# Patient Record
Sex: Female | Born: 1970 | Race: White | Hispanic: No | Marital: Married | Smoking: Former smoker
Health system: Southern US, Community
[De-identification: ages and names within clinical notes are randomized; demographics above are authoritative.]

## PROBLEM LIST (undated history)

## (undated) DIAGNOSIS — M549 Dorsalgia, unspecified: Secondary | ICD-10-CM

## (undated) DIAGNOSIS — R87629 Unspecified abnormal cytological findings in specimens from vagina: Secondary | ICD-10-CM

## (undated) DIAGNOSIS — K759 Inflammatory liver disease, unspecified: Secondary | ICD-10-CM

## (undated) DIAGNOSIS — E039 Hypothyroidism, unspecified: Secondary | ICD-10-CM

## (undated) DIAGNOSIS — N920 Excessive and frequent menstruation with regular cycle: Secondary | ICD-10-CM

## (undated) DIAGNOSIS — K635 Polyp of colon: Secondary | ICD-10-CM

## (undated) DIAGNOSIS — F4310 Post-traumatic stress disorder, unspecified: Secondary | ICD-10-CM

## (undated) DIAGNOSIS — E079 Disorder of thyroid, unspecified: Secondary | ICD-10-CM

## (undated) DIAGNOSIS — F32A Depression, unspecified: Secondary | ICD-10-CM

## (undated) DIAGNOSIS — R102 Pelvic and perineal pain: Secondary | ICD-10-CM

## (undated) DIAGNOSIS — F329 Major depressive disorder, single episode, unspecified: Secondary | ICD-10-CM

## (undated) DIAGNOSIS — N926 Irregular menstruation, unspecified: Secondary | ICD-10-CM

## (undated) DIAGNOSIS — F419 Anxiety disorder, unspecified: Secondary | ICD-10-CM

## (undated) DIAGNOSIS — Z8719 Personal history of other diseases of the digestive system: Secondary | ICD-10-CM

## (undated) DIAGNOSIS — K219 Gastro-esophageal reflux disease without esophagitis: Secondary | ICD-10-CM

## (undated) DIAGNOSIS — D510 Vitamin B12 deficiency anemia due to intrinsic factor deficiency: Secondary | ICD-10-CM

## (undated) DIAGNOSIS — E785 Hyperlipidemia, unspecified: Secondary | ICD-10-CM

## (undated) HISTORY — DX: Depression, unspecified: F32.A

## (undated) HISTORY — DX: Polyp of colon: K63.5

## (undated) HISTORY — PX: CHOLECYSTECTOMY: SHX55

## (undated) HISTORY — DX: Excessive and frequent menstruation with regular cycle: N92.0

## (undated) HISTORY — DX: Vitamin B12 deficiency anemia due to intrinsic factor deficiency: D51.0

## (undated) HISTORY — PX: ABDOMINAL HYSTERECTOMY: SHX81

## (undated) HISTORY — PX: COLPOSCOPY: SHX161

## (undated) HISTORY — DX: Pelvic and perineal pain: R10.2

## (undated) HISTORY — DX: Irregular menstruation, unspecified: N92.6

## (undated) HISTORY — DX: Unspecified abnormal cytological findings in specimens from vagina: R87.629

## (undated) HISTORY — DX: Hyperlipidemia, unspecified: E78.5

## (undated) HISTORY — DX: Dorsalgia, unspecified: M54.9

---
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
## 2008-10-21 HISTORY — PX: GALLBLADDER SURGERY: SHX652

## 2014-12-23 LAB — HM PAP SMEAR: HM Pap smear: NEGATIVE

## 2015-01-20 ENCOUNTER — Ambulatory Visit
Admit: 2015-01-20 | Disposition: A | Discharge status: Home / Self Care | Payer: Self-pay | Attending: Obstetrics and Gynecology | Admitting: Obstetrics and Gynecology

## 2015-04-28 ENCOUNTER — Other Ambulatory Visit: Payer: Self-pay

## 2015-04-28 MED ORDER — CHOLESTYRAMINE 4 GM/DOSE PO POWD: 4.0000 g | Freq: Two times a day (BID) | ORAL | Status: DC

## 2015-04-28 NOTE — Telephone Encounter (Signed)
Called patient to let her know that refill has been sent to the pharmacy at this time.  No answer. Left VM with this information and to call back with any questions.

## 2015-05-30 ENCOUNTER — Other Ambulatory Visit: Payer: Self-pay

## 2015-05-30 NOTE — Telephone Encounter (Signed)
Received refill request for Cholestyramine at this time for this patient.  Pt last seen by Vickey Huger, NP on 12/28/14 and was to have Colonoscopy but cancelled this procedure.  Refill request was denied until patient has follow-up recommended. Pharmacy notified at this time.

## 2015-12-25 ENCOUNTER — Encounter: Payer: Self-pay | Admitting: *Deleted

## 2015-12-27 ENCOUNTER — Encounter: Payer: Self-pay | Admitting: Obstetrics and Gynecology

## 2016-02-15 ENCOUNTER — Other Ambulatory Visit: Payer: Self-pay | Admitting: Obstetrics and Gynecology

## 2016-02-15 ENCOUNTER — Encounter: Payer: Self-pay | Admitting: Obstetrics and Gynecology

## 2016-02-15 ENCOUNTER — Ambulatory Visit (INDEPENDENT_AMBULATORY_CARE_PROVIDER_SITE_OTHER): Payer: BLUE CROSS/BLUE SHIELD | Admitting: Obstetrics and Gynecology

## 2016-02-15 VITALS — BP 130/75 | HR 70 | Ht 62.0 in | Wt 151.9 lb

## 2016-02-15 DIAGNOSIS — E663 Overweight: Secondary | ICD-10-CM

## 2016-02-15 DIAGNOSIS — E559 Vitamin D deficiency, unspecified: Secondary | ICD-10-CM | POA: Diagnosis not present

## 2016-02-15 DIAGNOSIS — Z01419 Encounter for gynecological examination (general) (routine) without abnormal findings: Secondary | ICD-10-CM

## 2016-02-15 NOTE — Patient Instructions (Signed)
  Place annual gynecologic exam patient instructions here.  Thank you for enrolling in [facility]. Please follow the instructions below to securely access your online medical record. MyChart allows you to send messages to your doctor, view your test results, manage appointments, and more.   How Do I Sign Up? 1. In your Internet browser, go to [employer] and enter [email or web address]. 2. Click on the Sign Up Now link in the Sign In box. You will see the New Member Sign Up page. 3. Enter your MyChart Access Code exactly as it appears below. You will not need to use this code after you've completed the sign-up process. If you do not sign up before the expiration date, you must request a new code.  MyChart Access Code: [email or web address] Expires: 02/24/2016 12:40 PM  4. Enter your Social Security Number ([id number]) and Date of Birth (mm/dd/yyyy) as indicated and click Submit. You will be taken to the next sign-up page. 5. Create a MyChart ID. This will be your MyChart login ID and cannot be changed, so think of one that is secure and easy to remember. 6. Create a MyChart password. You can change your password at any time. 7. Enter your Password Reset Question and Answer. This can be used at a later time if you forget your password.  8. Enter your e-mail address. You will receive e-mail notification when new information is available in [facility]. 9. Click Sign Up. You can now view your medical record.   Additional Information Remember, MyChart is NOT to be used for urgent needs. For medical emergencies, dial 911.

## 2016-02-15 NOTE — Progress Notes (Signed)
Subjective:   Evelyn Holloway is a 45 y.o. G31P0011 Caucasian female here for a routine well-woman exam.  Patient's last menstrual period was 01/29/2016.    Current complaints: weight gain PCP: none       does desire labs  Social History: Sexual: heterosexual Marital Status: divorced Living situation: with family Occupation: PT Tobacco/alcohol: no alcohol use Illicit drugs: no history of illicit drug use  The following portions of the patient's history were reviewed and updated as appropriate: allergies, current medications, past family history, past medical history, past social history, past surgical history and problem list.  Past Medical History Past Medical History  Diagnosis Date  . Vaginal Pap smear, abnormal   . Vitamin B12 deficiency anemia due to intrinsic factor deficiency   . Irregular menses   . Pelvic pain in female   . Heavy periods     Past Surgical History Past Surgical History  Procedure Laterality Date  . Colposcopy    . Gallbladder surgery  2010    Gynecologic History G2P0011  Patient's last menstrual period was 01/29/2016. Contraception: condoms Last Pap: 2016. Results were: normal, H/O LEEP in past Last mammogram: 2016. Results were: normal   Obstetric History OB History  Gravida Para Term Preterm AB SAB TAB Ectopic Multiple Living  2    1  1   1     # Outcome Date GA Lbr Len/2nd Weight Sex Delivery Anes PTL Lv  2 Gravida 1995    F Vag-Spont   Y  1 TAB               Current Medications Current Outpatient Prescriptions on File Prior to Visit  Medication Sig Dispense Refill  . cholestyramine (QUESTRAN) 4 GM/DOSE powder Take 1 packet (4 g total) by mouth 2 (two) times daily with a meal. (Patient not taking: Reported on 02/15/2016) 120 g 9   No current facility-administered medications on file prior to visit.    Review of Systems Patient denies any headaches, blurred vision, shortness of breath, chest pain, abdominal pain, problems with  bowel movements, urination, or intercourse.  Objective:  BP 130/75 mmHg  Pulse 70  Ht 5\' 2"  (1.575 m)  Wt 151 lb 14.4 oz (68.901 kg)  BMI 27.78 kg/m2  LMP 01/29/2016 Physical Exam  General:  Well developed, well nourished, no acute distress. She is alert and oriented x3. Skin:  Warm and dry Neck:  Midline trachea, no thyromegaly or nodules Cardiovascular: Regular rate and rhythm, no murmur heard Lungs:  Effort normal, all lung fields clear to auscultation bilaterally Breasts:  No dominant palpable mass, retraction, or nipple discharge Abdomen:  Soft, non tender, no hepatosplenomegaly or masses Pelvic:  External genitalia is normal in appearance.  The vagina is normal in appearance. The cervix is bulbous, no CMT.  Thin prep pap is done with HR HPV cotesting. Uterus is felt to be normal size, shape, and contour.  No adnexal masses or tenderness noted. Extremities:  No swelling or varicosities noted Psych:  She has a normal mood and affect  Assessment:   Healthy well-woman exam Vit D Deficiency Overweight   Plan:  Pap and labs obtained F/U 1 year for AE, or sooner if needed Mammogram scheduled  Melody Rockney Ghee, CNM

## 2016-02-16 LAB — HEMOGLOBIN A1C
Est. average glucose Bld gHb Est-mCnc: 111 mg/dL
Hgb A1c MFr Bld: 5.5 % (ref 4.8–5.6)

## 2016-02-16 LAB — COMPREHENSIVE METABOLIC PANEL
ALBUMIN: 4.7 g/dL (ref 3.5–5.5)
ALK PHOS: 80 IU/L (ref 39–117)
ALT: 20 IU/L (ref 0–32)
AST: 17 IU/L (ref 0–40)
Albumin/Globulin Ratio: 2.1 (ref 1.2–2.2)
BUN / CREAT RATIO: 16 (ref 9–23)
BUN: 9 mg/dL (ref 6–24)
Bilirubin Total: 0.3 mg/dL (ref 0.0–1.2)
CHLORIDE: 100 mmol/L (ref 96–106)
CO2: 18 mmol/L (ref 18–29)
Calcium: 9.1 mg/dL (ref 8.7–10.2)
Creatinine, Ser: 0.58 mg/dL (ref 0.57–1.00)
GFR calc Af Amer: 131 mL/min/{1.73_m2} (ref >59)
GFR calc non Af Amer: 113 mL/min/{1.73_m2} (ref >59)
GLOBULIN, TOTAL: 2.2 g/dL (ref 1.5–4.5)
GLUCOSE: 95 mg/dL (ref 65–99)
Potassium: 4.6 mmol/L (ref 3.5–5.2)
SODIUM: 139 mmol/L (ref 134–144)
Total Protein: 6.9 g/dL (ref 6.0–8.5)

## 2016-02-16 LAB — THYROID PANEL WITH TSH
Free Thyroxine Index: 1.8 (ref 1.2–4.9)
T3 Uptake Ratio: 26 % (ref 24–39)
T4 TOTAL: 7 ug/dL (ref 4.5–12.0)
TSH: 5 u[IU]/mL — AB (ref 0.450–4.500)

## 2016-02-16 LAB — VITAMIN D 25 HYDROXY (VIT D DEFICIENCY, FRACTURES): Vit D, 25-Hydroxy: 47.7 ng/mL (ref 30.0–100.0)

## 2016-02-19 LAB — CYTOLOGY - PAP

## 2016-02-20 ENCOUNTER — Other Ambulatory Visit: Payer: Self-pay | Admitting: Obstetrics and Gynecology

## 2016-02-20 DIAGNOSIS — R7989 Other specified abnormal findings of blood chemistry: Secondary | ICD-10-CM

## 2016-02-26 ENCOUNTER — Telehealth: Payer: Self-pay | Admitting: *Deleted

## 2016-02-26 ENCOUNTER — Other Ambulatory Visit: Payer: Self-pay | Admitting: *Deleted

## 2016-02-26 NOTE — Telephone Encounter (Signed)
Notified pt of lab results, she will come in 2 weeks for repeat labs

## 2016-03-11 ENCOUNTER — Other Ambulatory Visit: Payer: BLUE CROSS/BLUE SHIELD

## 2016-03-11 ENCOUNTER — Other Ambulatory Visit: Payer: Self-pay | Admitting: *Deleted

## 2016-03-11 ENCOUNTER — Other Ambulatory Visit: Payer: Self-pay

## 2016-03-11 ENCOUNTER — Other Ambulatory Visit: Payer: Self-pay | Admitting: Obstetrics and Gynecology

## 2016-03-11 DIAGNOSIS — Z01419 Encounter for gynecological examination (general) (routine) without abnormal findings: Secondary | ICD-10-CM

## 2016-03-11 DIAGNOSIS — E785 Hyperlipidemia, unspecified: Secondary | ICD-10-CM

## 2016-03-11 DIAGNOSIS — R7989 Other specified abnormal findings of blood chemistry: Secondary | ICD-10-CM

## 2016-03-11 MED ORDER — VITAMIN D (ERGOCALCIFEROL) 1.25 MG (50000 UNIT) PO CAPS: 50000.0000 [IU] | ORAL_CAPSULE | ORAL | Status: DC

## 2016-03-12 LAB — THYROID PANEL WITH TSH
FREE THYROXINE INDEX: 1.9 (ref 1.2–4.9)
T3 Uptake Ratio: 28 % (ref 24–39)
T4, Total: 6.8 ug/dL (ref 4.5–12.0)
TSH: 6.24 u[IU]/mL — AB (ref 0.450–4.500)

## 2016-03-12 LAB — THYROGLOBULIN ANTIBODY: Thyroglobulin Antibody: 8.5 IU/mL — ABNORMAL HIGH (ref 0.0–0.9)

## 2016-03-13 LAB — NMR, LIPOPROFILE
Cholesterol: 233 mg/dL — ABNORMAL HIGH (ref 100–199)
HDL CHOLESTEROL BY NMR: 87 mg/dL (ref >39)
HDL PARTICLE NUMBER: 45.3 umol/L (ref >=30.5)
LDL Particle Number: 1218 nmol/L — ABNORMAL HIGH (ref <1000)
LDL SIZE: 22 nm (ref >20.5)
LDL-C: 121 mg/dL — ABNORMAL HIGH (ref 0–99)
SMALL LDL PARTICLE NUMBER: 134 nmol/L (ref <=527)
Triglycerides by NMR: 123 mg/dL (ref 0–149)

## 2016-03-21 ENCOUNTER — Other Ambulatory Visit: Payer: Self-pay | Admitting: Obstetrics and Gynecology

## 2016-03-21 DIAGNOSIS — E785 Hyperlipidemia, unspecified: Secondary | ICD-10-CM

## 2016-03-21 DIAGNOSIS — R7989 Other specified abnormal findings of blood chemistry: Secondary | ICD-10-CM | POA: Insufficient documentation

## 2016-03-26 ENCOUNTER — Other Ambulatory Visit: Payer: Self-pay | Admitting: Obstetrics and Gynecology

## 2016-03-26 ENCOUNTER — Telehealth: Payer: Self-pay | Admitting: Obstetrics and Gynecology

## 2016-03-26 NOTE — Telephone Encounter (Signed)
Pt notified of referral. She would like to know about her cholesterol medication, take or not. I gave her results.

## 2016-03-26 NOTE — Telephone Encounter (Signed)
SHE CALLED FOR RESULTS THAT SHE HAD A FEW KS AGO. SHE SAID SHE SAW THEM BUT WANTS TO KNOW WHAT CAN BE DONE NEXT... DOES SHE NEED TO GO TO SPECIALIST OR WHAT?

## 2016-03-26 NOTE — Telephone Encounter (Signed)
Yes please let her know I placed referral order to [facility]- and hopefully she should get a call from them to set up an appointment within the week. It looks like her tyroid is not functioning fully, and may be an autoimmune cause. But will know more when they see her as that is what they specialize in.

## 2016-04-01 ENCOUNTER — Encounter: Payer: Self-pay | Admitting: Obstetrics and Gynecology

## 2016-05-06 ENCOUNTER — Ambulatory Visit (INDEPENDENT_AMBULATORY_CARE_PROVIDER_SITE_OTHER): Payer: BLUE CROSS/BLUE SHIELD | Admitting: Endocrinology

## 2016-05-06 ENCOUNTER — Encounter: Payer: Self-pay | Admitting: Endocrinology

## 2016-05-06 VITALS — BP 126/76 | HR 80 | Ht 62.0 in | Wt 150.0 lb

## 2016-05-06 DIAGNOSIS — E038 Other specified hypothyroidism: Secondary | ICD-10-CM

## 2016-05-06 DIAGNOSIS — E063 Autoimmune thyroiditis: Secondary | ICD-10-CM

## 2016-05-06 MED ORDER — LEVOTHYROXINE SODIUM 25 MCG PO TABS: 25.0000 ug | ORAL_TABLET | Freq: Every day | ORAL | Status: DC

## 2016-05-06 NOTE — Progress Notes (Signed)
Patient ID: Evelyn Holloway, female   DOB: 10-02-1971, 45 y.o.   MRN: [id number]           Chief complaint: Weight gain  Referring provider: Melody Shambley  History of Present Illness:  Patient thinks that she had no difficulty maintaining her weight in the 130+ range for the last few years After she went on a cruise in February her weight went up by about 18-20 pounds over 2 months  When she went to her gynecologist she was checked or hypothyroidism and was found to have a high TSH Currently she does not complain of any unusual fatigue.  She thinks she does get tired because of working long hours but does not think this is any worse than usual. She does not have any new symptoms of hair loss, constipation, hoarseness or swelling in her hands or feet. She does have some mild chronic hair loss No true cold intolerance.  She sometimes feels cold at night  She is concerned that she is trying to exercise 1-2 hours aerobically up to 5 days a week and not losing weight She thinks she is also watching her diet with usually not eating fried foods or any junk food but mostly chicken, fish and steak Her weight appears to have stabilized but she is concerned that she is not losing any  Wt Readings from Last 3 Encounters:  05/06/16 150 lb (68.04 kg)  02/15/16 151 lb 14.4 oz (68.901 kg)    Lab Results  Component Value Date   TSH 6.240* 03/11/2016   TSH 5.000* 02/15/2016      Past Medical History  Diagnosis Date  . Vaginal Pap smear, abnormal   . Vitamin B12 deficiency anemia due to intrinsic factor deficiency   . Irregular menses   . Pelvic pain in female   . Heavy periods   . Hyperlipidemia     Past Surgical History  Procedure Laterality Date  . Colposcopy    . Gallbladder surgery  2010    Family History  Problem Relation Age of Onset  . Cancer Maternal Grandfather     colon  . Hypertension Mother   . Thyroid disease Maternal Grandmother   . Diabetes Neg Hx   . Heart  disease Neg Hx     Social History:  reports that she has been smoking.  She has never used smokeless tobacco. She reports that she drinks alcohol. She reports that she does not use illicit drugs.  Allergies: No Known Allergies    Medication List       This list is accurate as of: 05/06/16  8:56 PM.  Always use your most recent med list.               levothyroxine 25 MCG tablet  Commonly known as:  SYNTHROID  Take 1 tablet (25 mcg total) by mouth daily before breakfast.     Vitamin D (Ergocalciferol) 50000 units Caps capsule  Commonly known as:  DRISDOL  Take 1 capsule (50,000 Units total) by mouth once a week.        LABS:  No visits with results within 1 Week(s) from this visit. Latest known visit with results is:  Orders Only on 03/11/2016  Component Date Value Ref Range Status  . TSH 03/11/2016 6.240* 0.450 - 4.500 uIU/mL Final  . T4, Total 03/11/2016 6.8  4.5 - 12.0 ug/dL Final  . T3 Uptake Ratio 03/11/2016 28  24 - 39 % Final  . Free Thyroxine Index  03/11/2016 1.9  1.2 - 4.9 Final  . Thyroglobulin Antibody 03/11/2016 8.5* 0.0 - 0.9 IU/mL Final   Thyroglobulin Antibody measured by Beckman Coulter Methodology     REVIEW OF SYSTEMS:        Review of Systems  Constitutional: Positive for weight gain.  HENT: Negative for hoarseness.   Eyes:       Occasional transient blurring of vision, see neurology section  Respiratory: Negative for shortness of breath.   Cardiovascular: Negative for leg swelling.  Gastrointestinal: Negative for constipation.       Occasional mild nausea especially in the morning  Endocrine: Negative for menstrual changes.       Sometimes will get cold at night.  Otherwise tends to get some hot flashes both day and night  Genitourinary: Negative for nocturia.  Musculoskeletal: Positive for muscle cramps. Negative for joint pain.       Has occasional muscle cramps in the calf muscles  Skin:       No dryness of skin or other changes    Neurological: Positive for numbness.       She may sometimes get numbness in the first toes of either side Occasionally while at work during the day she will feel a little fuzzy in her eyes and head but no lightheadedness or faintness.  Cannot describe well.  She just has to to slow down for a couple of minutes and then feels better     PHYSICAL EXAM:  BP 126/76 mmHg  Pulse 80  Ht 5\' 2"  (1.575 m)  Wt 150 lb (68.04 kg)  BMI 27.43 kg/m2  SpO2 96%  Standing blood pressure 122/72  GENERAL:  Averagely built and nourished  No pallor, clubbing, lymphadenopathy or edema.   Skin:  no rash or pigmentation.  EYES:  Externally normal.  Fundii:  normal discs and vessels.  ENT: Oral mucosa and tongue normal.  No oral pigmentation  THYROID:  Not palpable.  HEART:  Normal  S1 and S2; no murmur or click.  CHEST:  Normal shape.  Lungs: Vescicular breath sounds heard equally.  No crepitations/ wheeze.  ABDOMEN:  No distention.  Liver and spleen not palpable.  No other mass or tenderness.  NEUROLOGICAL: .Reflexes are normal bilaterally at biceps and ankles.  JOINTS:  Normal.   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  She does not appear to be significantly symptomatic and her only symptom is weight gain which is relatively rapid over a couple of months.  She does not have any menstrual disorder, impaired fasting glucose or signs of hyperandrogenism to suggest PCOS  Weight gain: This appears to have stabilized  She has hyperlipidemia with mild increase in LDL but relatively higher LDL particle number.  However has no other risk factors  Vague episodes of visual difficulty but no lightheadedness or faintness, transient and does not have any orthostatic hypotension or typical symptoms of adrenal insufficiency   PLAN:    Trial of levothyroxine 25 g daily to see if she will subjectively do any better.  Not clear this will  facilitate any weight loss.  Discussed in detail the causation of hypothyroidism by an autoimmune reaction.  Also discussed needing to supplement with levothyroxine which is identical to natural thyroid hormone product and adjust the dose based on follow-up TSH levels  No need for statin drugs or other lipid-lowering medications at this time with her not having any risk factors.  Her LDL particle number  should improve with weight loss  Follow-up in 6 weeks with thyroid levels  Jahmier Willadsen 05/06/2016, 8:56 PM   Notes sent back to consulting provider

## 2016-06-12 ENCOUNTER — Other Ambulatory Visit (INDEPENDENT_AMBULATORY_CARE_PROVIDER_SITE_OTHER): Payer: BLUE CROSS/BLUE SHIELD

## 2016-06-12 DIAGNOSIS — E063 Autoimmune thyroiditis: Secondary | ICD-10-CM

## 2016-06-12 DIAGNOSIS — E038 Other specified hypothyroidism: Secondary | ICD-10-CM

## 2016-06-12 LAB — T4, FREE: FREE T4: 0.89 ng/dL (ref 0.60–1.60)

## 2016-06-12 LAB — TSH: TSH: 1.79 u[IU]/mL (ref 0.35–4.50)

## 2016-06-13 ENCOUNTER — Other Ambulatory Visit: Payer: BLUE CROSS/BLUE SHIELD

## 2016-06-17 ENCOUNTER — Encounter: Payer: Self-pay | Admitting: Endocrinology

## 2016-06-17 ENCOUNTER — Ambulatory Visit (INDEPENDENT_AMBULATORY_CARE_PROVIDER_SITE_OTHER): Payer: Managed Care, Other (non HMO) | Admitting: Endocrinology

## 2016-06-17 VITALS — BP 105/77 | HR 67 | Ht 62.0 in | Wt 150.0 lb

## 2016-06-17 DIAGNOSIS — E063 Autoimmune thyroiditis: Secondary | ICD-10-CM

## 2016-06-17 DIAGNOSIS — E7800 Pure hypercholesterolemia, unspecified: Secondary | ICD-10-CM

## 2016-06-17 DIAGNOSIS — E038 Other specified hypothyroidism: Secondary | ICD-10-CM

## 2016-06-17 NOTE — Progress Notes (Signed)
Patient ID: Evelyn Holloway, female   DOB: [birth year], 45 y.o.   MRN: [id number]           Chief complaint: Follow-up of thyroid  Referring provider: Melody Shambley  History of Present Illness:  Patient thinks that she had no difficulty maintaining her weight in the 130+ range for the last few years but in 2017 and has been around 150.  This is despite her exercising 5 days a week and eating healthy When she went to her gynecologist she was checked or hypothyroidism and was found to have a high TSH On initial consultation she did not have any unusual fatigue.   She thinks she does get tired because of working long hours but does not think this is any worse than usual. She does not have any  symptoms of hair loss, constipation, hoarseness or swelling in her hands or feet.  Although she was asymptomatic because of her high TSH of 6.2 she was given a trial of levothyroxine 25 g daily on her initial visit in 7/17.  Do not have a goiter on baseline exam  She thinks that she has lost 2 pounds She does not feel any different with her energy level TSH is back to normal  Wt Readings from Last 3 Encounters:  06/17/16 150 lb (68 kg)  05/06/16 150 lb (68 kg)  02/15/16 151 lb 14.4 oz (68.9 kg)    Lab Results  Component Value Date   TSH 1.79 06/12/2016   TSH 6.240 (H) 03/11/2016   TSH 5.000 (H) 02/15/2016   FREET4 0.89 06/12/2016      Past Medical History:  Diagnosis Date  . Heavy periods   . Hyperlipidemia   . Irregular menses   . Pelvic pain in female   . Vaginal Pap smear, abnormal   . Vitamin B12 deficiency anemia due to intrinsic factor deficiency     Past Surgical History:  Procedure Laterality Date  . COLPOSCOPY    . GALLBLADDER SURGERY  2010    Family History  Problem Relation Age of Onset  . Cancer Maternal Grandfather     colon  . Hypertension Mother   . Thyroid disease Maternal Grandmother   . Diabetes Neg Hx   . Heart disease Neg Hx     Social History:   reports that she has been smoking.  She has never used smokeless tobacco. She reports that she drinks alcohol. She reports that she does not use drugs.  Allergies: No Known Allergies    Medication List       Accurate as of 06/17/16  4:50 PM. Always use your most recent med list.          levothyroxine 25 MCG tablet Commonly known as:  SYNTHROID Take 1 tablet (25 mcg total) by mouth daily before breakfast.   Vitamin D (Ergocalciferol) 50000 units Caps capsule Commonly known as:  DRISDOL Take 1 capsule (50,000 Units total) by mouth once a week.       LABS:  Lab on 06/12/2016  Component Date Value Ref Range Status  . TSH 06/12/2016 1.79  0.35 - 4.50 uIU/mL Final  . Free T4 06/12/2016 0.89  0.60 - 1.60 ng/dL Final        Review of Systems  She is asking about an episode of intermenstrual bleeding Also concerned about reddish areas on her neck  HYPERLIPIDEMIA: Her previous physicians had recommended a statin drug but she does not have any other risk factors. LDL particle number was  over 1200 but LDL was only 121 and LDL size was normal at 22   PHYSICAL EXAM:  BP 105/77   Pulse 67   Ht 5\' 2"  (1.575 m)   Wt 150 lb (68 kg)   BMI 27.44 kg/m     ASSESSMENT:   Mild primary hypothyroidism, likely asymptomatic, with high baseline TSH and positive thyroglobulin antibody.   Although she does not report any change in her physical symptoms she has lost a couple of pounds according to her scales at home TSH is normal now with 25 g of levothyroxine  Discussed with the patient that since she is subjectively not feeling any different with trial of levothyroxine she likely has subclinical hypothyroidism and does not really need to be on thyroid supplementation at this time Also does not have a goiter at baseline  However the patient prefers to stay on the medication  Also recommended that she does not start any treatment for mild hypercholesterolemia without any other risk  factors  PLAN:   Continue 25 g levothyroxine Follow-up in 6 months with thyroid levels and lipid levels  [name] 06/17/2016, 4:50 PM

## 2016-08-08 ENCOUNTER — Other Ambulatory Visit: Payer: Self-pay | Admitting: Obstetrics and Gynecology

## 2016-09-02 ENCOUNTER — Other Ambulatory Visit: Payer: Self-pay | Admitting: Endocrinology

## 2016-12-09 ENCOUNTER — Encounter: Payer: Self-pay | Admitting: Emergency Medicine

## 2016-12-09 ENCOUNTER — Emergency Department
Admission: EM | Admit: 2016-12-09 | Discharge: 2016-12-10 | Disposition: A | Discharge status: Home / Self Care | Payer: Worker's Compensation | Attending: Emergency Medicine | Admitting: Emergency Medicine

## 2016-12-09 DIAGNOSIS — F17200 Nicotine dependence, unspecified, uncomplicated: Secondary | ICD-10-CM | POA: Diagnosis not present

## 2016-12-09 DIAGNOSIS — M545 Low back pain: Secondary | ICD-10-CM | POA: Insufficient documentation

## 2016-12-09 DIAGNOSIS — M62838 Other muscle spasm: Secondary | ICD-10-CM | POA: Diagnosis not present

## 2016-12-09 DIAGNOSIS — R109 Unspecified abdominal pain: Secondary | ICD-10-CM

## 2016-12-09 DIAGNOSIS — Z79899 Other long term (current) drug therapy: Secondary | ICD-10-CM | POA: Insufficient documentation

## 2016-12-09 LAB — CBC WITH DIFFERENTIAL/PLATELET
Basophils Absolute: 0.1 10*3/uL (ref 0–0.1)
Basophils Relative: 1 %
EOS PCT: 5 %
Eosinophils Absolute: 0.6 10*3/uL (ref 0–0.7)
HCT: 43.4 % (ref 35.0–47.0)
Hemoglobin: 14.9 g/dL (ref 12.0–16.0)
LYMPHS ABS: 2.6 10*3/uL (ref 1.0–3.6)
Lymphocytes Relative: 23 %
MCH: 31.5 pg (ref 26.0–34.0)
MCHC: 34.3 g/dL (ref 32.0–36.0)
MCV: 91.9 fL (ref 80.0–100.0)
MONO ABS: 0.9 10*3/uL (ref 0.2–0.9)
MONOS PCT: 8 %
Neutro Abs: 7.1 10*3/uL — ABNORMAL HIGH (ref 1.4–6.5)
Neutrophils Relative %: 63 %
PLATELETS: 297 10*3/uL (ref 150–440)
RBC: 4.72 MIL/uL (ref 3.80–5.20)
RDW: 12.1 % (ref 11.5–14.5)
WBC: 11.3 10*3/uL — ABNORMAL HIGH (ref 3.6–11.0)

## 2016-12-09 MED ORDER — CARISOPRODOL 350 MG PO TABS: 350.0000 mg | ORAL_TABLET | Freq: Three times a day (TID) | ORAL | 0 refills | Status: DC | PRN

## 2016-12-09 MED ORDER — OXYCODONE-ACETAMINOPHEN 5-325 MG PO TABS
ORAL_TABLET | ORAL | Status: AC
  Filled 2016-12-09: qty 1

## 2016-12-09 MED ORDER — DICLOFENAC SODIUM 3 % TD GEL: 1.0000 "application " | Freq: Two times a day (BID) | TRANSDERMAL | 0 refills | Status: DC | PRN

## 2016-12-09 MED ORDER — OXYCODONE-ACETAMINOPHEN 5-325 MG PO TABS: 1.0000 | ORAL_TABLET | Freq: Once | ORAL | Status: DC
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
## 2017-02-18 ENCOUNTER — Encounter: Payer: BLUE CROSS/BLUE SHIELD | Admitting: Obstetrics and Gynecology

## 2017-03-19 ENCOUNTER — Encounter: Payer: BLUE CROSS/BLUE SHIELD | Admitting: Obstetrics and Gynecology

## 2017-05-30 ENCOUNTER — Encounter: Payer: BLUE CROSS/BLUE SHIELD | Admitting: Obstetrics and Gynecology

## 2017-06-10 ENCOUNTER — Other Ambulatory Visit: Payer: Self-pay | Admitting: Physical Medicine and Rehabilitation

## 2017-06-11 ENCOUNTER — Other Ambulatory Visit: Payer: Self-pay | Admitting: Physical Medicine and Rehabilitation

## 2017-06-11 DIAGNOSIS — G8911 Acute pain due to trauma: Secondary | ICD-10-CM

## 2017-06-11 DIAGNOSIS — R1010 Upper abdominal pain, unspecified: Secondary | ICD-10-CM

## 2017-06-16 ENCOUNTER — Ambulatory Visit
Admission: RE | Admit: 2017-06-16 | Discharge: 2017-06-16 | Disposition: A | Discharge status: Home / Self Care | Payer: Worker's Compensation | Source: Ambulatory Visit | Attending: Physical Medicine and Rehabilitation | Admitting: Physical Medicine and Rehabilitation

## 2017-06-16 DIAGNOSIS — R109 Unspecified abdominal pain: Secondary | ICD-10-CM | POA: Diagnosis present

## 2017-06-16 DIAGNOSIS — G8911 Acute pain due to trauma: Secondary | ICD-10-CM | POA: Insufficient documentation

## 2017-06-16 DIAGNOSIS — Z9049 Acquired absence of other specified parts of digestive tract: Secondary | ICD-10-CM | POA: Diagnosis not present

## 2017-06-16 DIAGNOSIS — R1010 Upper abdominal pain, unspecified: Secondary | ICD-10-CM

## 2017-06-17 ENCOUNTER — Other Ambulatory Visit: Payer: Self-pay | Admitting: Obstetrics and Gynecology

## 2017-06-18 ENCOUNTER — Ambulatory Visit (INDEPENDENT_AMBULATORY_CARE_PROVIDER_SITE_OTHER): Payer: PRIVATE HEALTH INSURANCE | Admitting: Obstetrics and Gynecology

## 2017-06-18 ENCOUNTER — Encounter: Payer: Self-pay | Admitting: Obstetrics and Gynecology

## 2017-06-18 VITALS — BP 123/84 | HR 64 | Ht 62.0 in | Wt 152.8 lb

## 2017-06-18 DIAGNOSIS — E559 Vitamin D deficiency, unspecified: Secondary | ICD-10-CM | POA: Diagnosis not present

## 2017-06-18 DIAGNOSIS — Z01419 Encounter for gynecological examination (general) (routine) without abnormal findings: Secondary | ICD-10-CM | POA: Diagnosis not present
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
## 2017-06-18 NOTE — Patient Instructions (Signed)
Fish oil/omega 3 1000mg  capsules- 3 capsules three times a day for 4 days, then 3 capsules twice a day for 4 days, then 3 capsules once daily for 2 weeks

## 2017-06-19 LAB — COMPREHENSIVE METABOLIC PANEL
ALT: 17 IU/L (ref 0–32)
AST: 15 IU/L (ref 0–40)
Albumin/Globulin Ratio: 2.1 (ref 1.2–2.2)
Albumin: 5 g/dL (ref 3.5–5.5)
Alkaline Phosphatase: 86 IU/L (ref 39–117)
BUN/Creatinine Ratio: 16 (ref 9–23)
BUN: 11 mg/dL (ref 6–24)
Bilirubin Total: 0.3 mg/dL (ref 0.0–1.2)
CALCIUM: 9.7 mg/dL (ref 8.7–10.2)
CO2: 21 mmol/L (ref 20–29)
CREATININE: 0.7 mg/dL (ref 0.57–1.00)
Chloride: 104 mmol/L (ref 96–106)
GFR calc Af Amer: 121 mL/min/{1.73_m2} (ref >59)
GFR, EST NON AFRICAN AMERICAN: 105 mL/min/{1.73_m2} (ref >59)
GLOBULIN, TOTAL: 2.4 g/dL (ref 1.5–4.5)
GLUCOSE: 104 mg/dL — AB (ref 65–99)
Potassium: 5.1 mmol/L (ref 3.5–5.2)
Sodium: 141 mmol/L (ref 134–144)
Total Protein: 7.4 g/dL (ref 6.0–8.5)

## 2017-06-19 LAB — CBC
HEMATOCRIT: 44.3 % (ref 34.0–46.6)
HEMOGLOBIN: 15.4 g/dL (ref 11.1–15.9)
MCH: 31.8 pg (ref 26.6–33.0)
MCHC: 34.8 g/dL (ref 31.5–35.7)
MCV: 92 fL (ref 79–97)
Platelets: 264 10*3/uL (ref 150–379)
RBC: 4.84 x10E6/uL (ref 3.77–5.28)
RDW: 12.9 % (ref 12.3–15.4)
WBC: 8.7 10*3/uL (ref 3.4–10.8)

## 2017-06-19 LAB — TSH: TSH: 3.83 u[IU]/mL (ref 0.450–4.500)

## 2017-06-19 LAB — MAGNESIUM: MAGNESIUM: 2.2 mg/dL (ref 1.6–2.3)

## 2017-06-19 LAB — B12 AND FOLATE PANEL
Folate: 9 ng/mL (ref >3.0)
VITAMIN B 12: 936 pg/mL (ref 232–1245)

## 2017-06-19 LAB — LIPID PANEL
CHOL/HDL RATIO: 3 ratio (ref 0.0–4.4)
CHOLESTEROL TOTAL: 235 mg/dL — AB (ref 100–199)
HDL: 78 mg/dL (ref >39)
LDL Calculated: 133 mg/dL — ABNORMAL HIGH (ref 0–99)
Triglycerides: 122 mg/dL (ref 0–149)
VLDL Cholesterol Cal: 24 mg/dL (ref 5–40)

## 2017-06-19 LAB — FERRITIN: Ferritin: 85 ng/mL (ref 15–150)

## 2017-06-19 LAB — VITAMIN D 25 HYDROXY (VIT D DEFICIENCY, FRACTURES): VIT D 25 HYDROXY: 34.1 ng/mL (ref 30.0–100.0)

## 2017-06-22 ENCOUNTER — Encounter: Payer: Self-pay | Admitting: Obstetrics and Gynecology

## 2017-06-26 LAB — CYTOLOGY - PAP

## 2017-07-08 ENCOUNTER — Ambulatory Visit
Admission: RE | Admit: 2017-07-08 | Discharge: 2017-07-08 | Disposition: A | Discharge status: Home / Self Care | Payer: PRIVATE HEALTH INSURANCE | Source: Ambulatory Visit | Attending: Obstetrics and Gynecology | Admitting: Obstetrics and Gynecology

## 2017-07-08 DIAGNOSIS — Z1231 Encounter for screening mammogram for malignant neoplasm of breast: Secondary | ICD-10-CM | POA: Diagnosis present

## 2017-07-08 DIAGNOSIS — Z01419 Encounter for gynecological examination (general) (routine) without abnormal findings: Secondary | ICD-10-CM

## 2017-07-21 HISTORY — PX: COLONOSCOPY: SHX174

## 2017-07-21 HISTORY — PX: HEMORRHOID SURGERY: SHX153

## 2017-07-28 ENCOUNTER — Encounter: Payer: Self-pay | Admitting: Obstetrics and Gynecology

## 2017-10-07 ENCOUNTER — Encounter: Payer: Self-pay | Admitting: Psychology
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
## 2017-10-16 ENCOUNTER — Encounter: Payer: Worker's Compensation | Attending: Psychology | Admitting: Psychology

## 2017-10-16 DIAGNOSIS — N920 Excessive and frequent menstruation with regular cycle: Secondary | ICD-10-CM | POA: Diagnosis not present

## 2017-10-16 DIAGNOSIS — N926 Irregular menstruation, unspecified: Secondary | ICD-10-CM | POA: Insufficient documentation

## 2017-10-16 DIAGNOSIS — G479 Sleep disorder, unspecified: Secondary | ICD-10-CM | POA: Diagnosis not present

## 2017-10-16 DIAGNOSIS — D519 Vitamin B12 deficiency anemia, unspecified: Secondary | ICD-10-CM | POA: Insufficient documentation

## 2017-10-16 DIAGNOSIS — E785 Hyperlipidemia, unspecified: Secondary | ICD-10-CM | POA: Insufficient documentation

## 2017-10-16 DIAGNOSIS — F4310 Post-traumatic stress disorder, unspecified: Secondary | ICD-10-CM | POA: Diagnosis not present

## 2017-10-16 DIAGNOSIS — E079 Disorder of thyroid, unspecified: Secondary | ICD-10-CM | POA: Diagnosis not present

## 2017-10-16 DIAGNOSIS — F419 Anxiety disorder, unspecified: Secondary | ICD-10-CM | POA: Insufficient documentation

## 2017-10-19 ENCOUNTER — Encounter: Payer: Self-pay | Admitting: Emergency Medicine

## 2017-10-19 ENCOUNTER — Emergency Department: Payer: Worker's Compensation

## 2017-10-19 ENCOUNTER — Emergency Department
Admission: EM | Admit: 2017-10-19 | Discharge: 2017-10-19 | Disposition: A | Discharge status: Home / Self Care | Payer: Worker's Compensation | Attending: Emergency Medicine | Admitting: Emergency Medicine

## 2017-10-19 DIAGNOSIS — Z79899 Other long term (current) drug therapy: Secondary | ICD-10-CM | POA: Insufficient documentation

## 2017-10-19 DIAGNOSIS — R079 Chest pain, unspecified: Secondary | ICD-10-CM | POA: Diagnosis present

## 2017-10-19 DIAGNOSIS — E079 Disorder of thyroid, unspecified: Secondary | ICD-10-CM | POA: Insufficient documentation

## 2017-10-19 DIAGNOSIS — Z87891 Personal history of nicotine dependence: Secondary | ICD-10-CM | POA: Diagnosis not present

## 2017-10-19 DIAGNOSIS — R0789 Other chest pain: Secondary | ICD-10-CM | POA: Diagnosis not present

## 2017-10-19 HISTORY — DX: Disorder of thyroid, unspecified: E07.9

## 2017-10-19 LAB — COMPREHENSIVE METABOLIC PANEL
ALK PHOS: 78 U/L (ref 38–126)
ALT: 15 U/L (ref 14–54)
ANION GAP: 9 (ref 5–15)
AST: 27 U/L (ref 15–41)
Albumin: 4.3 g/dL (ref 3.5–5.0)
BILIRUBIN TOTAL: 0.6 mg/dL (ref 0.3–1.2)
BUN: 12 mg/dL (ref 6–20)
CALCIUM: 8.9 mg/dL (ref 8.9–10.3)
CO2: 22 mmol/L (ref 22–32)
Chloride: 106 mmol/L (ref 101–111)
Creatinine, Ser: 0.71 mg/dL (ref 0.44–1.00)
GFR calc non Af Amer: >60 mL/min (ref >60)
Glucose, Bld: 120 mg/dL — ABNORMAL HIGH (ref 65–99)
Potassium: 4 mmol/L (ref 3.5–5.1)
SODIUM: 137 mmol/L (ref 135–145)
TOTAL PROTEIN: 6.9 g/dL (ref 6.5–8.1)

## 2017-10-19 LAB — CBC WITH DIFFERENTIAL/PLATELET
BASOS PCT: 1 %
Basophils Absolute: 0.1 10*3/uL (ref 0–0.1)
EOS PCT: 3 %
Eosinophils Absolute: 0.3 10*3/uL (ref 0–0.7)
HEMATOCRIT: 42.3 % (ref 35.0–47.0)
Hemoglobin: 14.5 g/dL (ref 12.0–16.0)
Lymphocytes Relative: 21 %
Lymphs Abs: 1.8 10*3/uL (ref 1.0–3.6)
MCH: 31.7 pg (ref 26.0–34.0)
MCHC: 34.2 g/dL (ref 32.0–36.0)
MCV: 92.7 fL (ref 80.0–100.0)
MONO ABS: 0.8 10*3/uL (ref 0.2–0.9)
MONOS PCT: 10 %
NEUTROS ABS: 5.4 10*3/uL (ref 1.4–6.5)
Neutrophils Relative %: 65 %
PLATELETS: 257 10*3/uL (ref 150–440)
RBC: 4.57 MIL/uL (ref 3.80–5.20)
RDW: 12.3 % (ref 11.5–14.5)
WBC: 8.4 10*3/uL (ref 3.6–11.0)

## 2017-10-19 LAB — HCG, QUANTITATIVE, PREGNANCY: hCG, Beta Chain, Quant, S: 1 m[IU]/mL (ref <5)

## 2017-10-19 LAB — TROPONIN I: Troponin I: <0.03 ng/mL (ref <0.03)

## 2017-10-19 MED ORDER — ASPIRIN 81 MG PO CHEW
162.0000 mg | CHEWABLE_TABLET | Freq: Once | ORAL | Status: AC
  Administered 2017-10-19: 162 mg via ORAL
  Filled 2017-10-19: qty 2
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
## 2017-10-19 NOTE — ED Triage Notes (Signed)
Pt awoke with "crushing" chest pain at 0400 and pain in bilateral jaw after ems arrival. Pt states history of angina and anxiety. md at bedside.

## 2017-10-19 NOTE — ED Notes (Signed)
Recollect green sent to lab 

## 2017-10-19 NOTE — Discharge Instructions (Signed)
Fortunately today your chest x-ray, your EKG, and your blood work were reassuring.  Please make an appointment to establish care with cardiology this coming week for recheck and return to the emergency department sooner for any concerns whatsoever.  It was a pleasure to take care of you today, and thank you for coming to our emergency department.  If you have any questions or concerns before leaving please ask the nurse to grab me and I'm more than happy to go through your aftercare instructions again.  If you were prescribed any opioid pain medication today such as Norco, Vicodin, Percocet, morphine, hydrocodone, or oxycodone please make sure you do not drive when you are taking this medication as it can alter your ability to drive safely.  If you have any concerns once you are home that you are not improving or are in fact getting worse before you can make it to your follow-up appointment, please do not hesitate to call 911 and come back for further evaluation.  Darel Hong, MD  Results for orders placed or performed during the hospital encounter of 10/19/17  Troponin I  Result Value Ref Range   Troponin I <0.03 <0.03 ng/mL  CBC with Differential  Result Value Ref Range   WBC 8.4 3.6 - 11.0 K/uL   RBC 4.57 3.80 - 5.20 MIL/uL   Hemoglobin 14.5 12.0 - 16.0 g/dL   HCT 42.3 35.0 - 47.0 %   MCV 92.7 80.0 - 100.0 fL   MCH 31.7 26.0 - 34.0 pg   MCHC 34.2 32.0 - 36.0 g/dL   RDW 12.3 11.5 - 14.5 %   Platelets 257 150 - 440 K/uL   Neutrophils Relative % 65 %   Neutro Abs 5.4 1.4 - 6.5 K/uL   Lymphocytes Relative 21 %   Lymphs Abs 1.8 1.0 - 3.6 K/uL   Monocytes Relative 10 %   Monocytes Absolute 0.8 0.2 - 0.9 K/uL   Eosinophils Relative 3 %   Eosinophils Absolute 0.3 0 - 0.7 K/uL   Basophils Relative 1 %   Basophils Absolute 0.1 0 - 0.1 K/uL  hCG, quantitative, pregnancy  Result Value Ref Range   hCG, Beta Chain, Quant, S 1 <5 mIU/mL   Dg Chest 2 View  Result Date:  10/19/2017 CLINICAL DATA:  46 y/o  F; crushing chest pain. EXAM: CHEST  2 VIEW COMPARISON:  None. FINDINGS: The heart size and mediastinal contours are within normal limits. Both lungs are clear. The visualized skeletal structures are unremarkable. IMPRESSION: No active cardiopulmonary disease. Electronically Signed   By: Kristine Garbe M.D.   On: 10/19/2017 05:57

## 2017-10-29 ENCOUNTER — Encounter: Payer: Self-pay | Admitting: Psychology
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
## 2017-11-21 HISTORY — PX: BACK SURGERY: SHX140

## 2018-01-08 ENCOUNTER — Encounter: Payer: Self-pay | Admitting: *Deleted

## 2018-01-08 HISTORY — PX: COLONOSCOPY: SHX174

## 2018-01-22 ENCOUNTER — Encounter: Payer: Self-pay | Admitting: General Surgery

## 2018-01-22 ENCOUNTER — Ambulatory Visit (INDEPENDENT_AMBULATORY_CARE_PROVIDER_SITE_OTHER): Payer: BLUE CROSS/BLUE SHIELD | Admitting: General Surgery

## 2018-01-22 VITALS — BP 124/84 | HR 85 | Resp 12 | Ht 62.0 in | Wt 151.0 lb

## 2018-01-22 DIAGNOSIS — K620 Anal polyp: Secondary | ICD-10-CM | POA: Diagnosis not present

## 2018-01-22 NOTE — Patient Instructions (Signed)
The patient is aware to call back for any questions or concerns.  

## 2018-01-22 NOTE — Progress Notes (Signed)
Patient ID: Evelyn Holloway, female   DOB: 10-Nov-1970, 47 y.o.   MRN: [id number]  Chief Complaint  Patient presents with  . Other    HPI Evelyn Holloway is a 47 y.o. female.  Here today for evaluation of rectal polyp referred by Dr Vira Agar. Denies any GI issues. Bowels move daily, no bleeding.  HPI  Past Medical History:  Diagnosis Date  . Colon polyp   . Heavy periods   . Hyperlipidemia   . Irregular menses   . Pelvic pain in female   . Thyroid disease   . Vaginal Pap smear, abnormal   . Vitamin B12 deficiency anemia due to intrinsic factor deficiency     Past Surgical History:  Procedure Laterality Date  . BACK SURGERY     [place]  . CHOLECYSTECTOMY    . COLONOSCOPY  07/2017   in [place]/ colon polyps  . COLONOSCOPY  01/08/2018   Dr Vira Agar  . COLPOSCOPY    . GALLBLADDER SURGERY  2010  . HEMORRHOID SURGERY  07/2017   in [place]    Family History  Problem Relation Age of Onset  . Cancer Maternal Grandfather        colon  . Hypertension Mother   . Thyroid disease Maternal Grandmother   . Diabetes Neg Hx   . Heart disease Neg Hx     Social History Social History   Tobacco Use  . Smoking status: Former Research scientist (life sciences)  . Smokeless tobacco: Never Used  Substance Use Topics  . Alcohol use: Yes    Comment: occas  . Drug use: No    No Known Allergies  Current Outpatient Medications  Medication Sig Dispense Refill  . cyclobenzaprine (FLEXERIL) 10 MG tablet Take 10 mg by mouth at bedtime.    . Diclofenac Sodium 3 % GEL Place 1 application onto the skin every 12 (twelve) hours as needed (pain). 50 g 0  . FLUoxetine (PROZAC) 10 MG tablet Take 10 mg by mouth daily.    Marland Kitchen levothyroxine (SYNTHROID, LEVOTHROID) 25 MCG tablet TAKE 1 TABLET (25 MCG TOTAL) BY MOUTH DAILY BEFORE BREAKFAST. 30 tablet 3  . naproxen (NAPROSYN) 500 MG tablet Take 500 mg by mouth as needed.    . traMADol (ULTRAM) 50 MG tablet Take by mouth every 6 (six) hours as needed.    .  Vitamin D, Ergocalciferol, (DRISDOL) 50000 units CAPS capsule TAKE 1 CAPSULE BY MOUTH ONCE A WEEK. 30 capsule 3   No current facility-administered medications for this visit.     Review of Systems Review of Systems  Constitutional: Negative.   Respiratory: Negative.   Cardiovascular: Negative.     Blood pressure 124/84, pulse 85, resp. rate 12, height 5\' 2"  (1.575 m), weight 151 lb (68.5 kg), last menstrual period 01/15/2018, SpO2 97 %.  Physical Exam Physical Exam  Constitutional: She is oriented to person, place, and time. She appears well-developed and well-nourished.  HENT:  Mouth/Throat: Oropharynx is clear and moist.  Eyes: Conjunctivae are normal. No scleral icterus.  Neck: Neck supple.  Cardiovascular: Normal rate, regular rhythm and normal heart sounds.  Pulmonary/Chest: Effort normal and breath sounds normal.  Genitourinary:     Genitourinary Comments: Anal rectal polyp Left side  Lymphadenopathy:    She has no cervical adenopathy.  Neurological: She is alert and oriented to person, place, and time.  Skin: Skin is warm and dry.  Psychiatric: Her behavior is normal.    Data Reviewed Personal discussion with Dr. Tiffany Kocher.   Assessment  Anal polyp.      Plan    Patient reports having records available from her fall 2018 trip to [place] where she underwent an upper and lower endoscopy as well as excision of hemorrhoids.  She is been asked to bring this for review.      HPI, Physical Exam, Assessment and Plan have been scribed under the direction and in the presence of Robert Bellow, MD. Karie Fetch, RN  I have completed the exam and reviewed the above documentation for accuracy and completeness.  I agree with the above.  Haematologist has been used and any errors in dictation or transcription are unintentional.  Hervey Ard, M.D., F.A.C.S.  Forest Gleason Danuta Huseman 01/22/2018, 9:04 PM  Patient's surgery has been scheduled for 02-09-18 at [facility].    Dominga Ferry, CMA

## 2018-01-23 ENCOUNTER — Ambulatory Visit (INDEPENDENT_AMBULATORY_CARE_PROVIDER_SITE_OTHER): Payer: BLUE CROSS/BLUE SHIELD | Admitting: Obstetrics and Gynecology

## 2018-01-23 ENCOUNTER — Other Ambulatory Visit (INDEPENDENT_AMBULATORY_CARE_PROVIDER_SITE_OTHER): Payer: BLUE CROSS/BLUE SHIELD

## 2018-01-23 ENCOUNTER — Encounter: Payer: Self-pay | Admitting: Obstetrics and Gynecology

## 2018-01-23 VITALS — BP 123/84 | HR 75 | Ht 62.0 in | Wt 151.9 lb

## 2018-01-23 DIAGNOSIS — N920 Excessive and frequent menstruation with regular cycle: Secondary | ICD-10-CM | POA: Diagnosis not present

## 2018-01-23 DIAGNOSIS — N8000 Endometriosis of the uterus, unspecified: Secondary | ICD-10-CM

## 2018-01-23 DIAGNOSIS — Z800 Family history of malignant neoplasm of digestive organs: Secondary | ICD-10-CM

## 2018-01-23 DIAGNOSIS — N946 Dysmenorrhea, unspecified: Secondary | ICD-10-CM | POA: Diagnosis not present

## 2018-01-23 DIAGNOSIS — K635 Polyp of colon: Secondary | ICD-10-CM | POA: Diagnosis not present

## 2018-01-23 DIAGNOSIS — D251 Intramural leiomyoma of uterus: Secondary | ICD-10-CM | POA: Diagnosis not present

## 2018-01-23 NOTE — Patient Instructions (Signed)
????? ????? (Uterine Fibroids) ????? ????? ???????????? ????? ??????????? ????? (???????) ??????? ????? ????????? ???????? ???? (?????). ?? ????? ???????? ??????????. ???? ??? ??????? ?? ????????? ? ??????? (??????? ?????????????????); ??? ?? ???????????????? ?? ?????? ??????? ???? ?? ????????? ??????? ??????? (????? ????????? ????????? ??????). ?????? ????? ????? ??????????? ? ??????? ??????????? ????, ????? ????? ????????????????? ???????? (??????), ??????? ???????? ?????. ? ??? ????? ?????????????? ???? ??? ????????? ????. ????? ?????????? ?????????, ?????? ? ?????? ???????????? ? ?????. ????????? ?? ??? ????? ???? ???????? ????????. ? ??????????? ??????? ??? ?????????? ??????? ?? ?????????. ??????? ????? ????? ???????????, ????? ???? ?? ?????? ????? ???????? [??????????] ????? (????????????). ? ??????????? ?????? ????????????? ???????????????? ???????? ???????? ?????, ??????? ?? ????????? ?? ????????????? ????????????. ???????? ? ???????? ?????????? ????? ????????? ????? ????:  ???????? ????????????? ????????????.  ???????????? ??? ???????????? ? ?????? ????? ????????????.  ???? ? ??????? ????, ???????? ????????.  ????????? ?? ??????? ???????? ??????, ????? ??? ????????????? ???????? ???? (?????? ??????????????) ??? ???????????? ?????? ? ??????????????.  ????????????? ????????????? ????????? (?????????).  ????????. ??????? ?????????? ????? ????????????? ??????????? ???????????? ????????????. ??? ??????? ???? ????? ??????????????? ??????????????? ?? ????? ??????? ??????? ????. ????? ?????????? ??????, ???????????? ? ?????????? ????????? ????????, ????? ???? ????????? ??? ? ???-????????????. ??????? ??????? ????? ???????? ?????????:  ????????????? ???????. ?? ????? ?????????? ???????? ???? ?????? ?????????? ????????? ??????????: ?????? ??? ??? ???????????. ???????? ????????????? ??????????? ? ?????????????, ??? ???????? ??????????????????.  ???????????? ?????????. ??  ?????????????? ??? ?????? ????????? ?????? ??????????? ?????????????? ??????? (???).  ????????????? ?????????????. ? ????????? ???????? ?? ???????? ????????? (???????????) ??? ???????? ????? (?????????????). ? ?????????? ?????????????? ????? (??????????? ???????? ???????). ???? ?????????? ?? ????????? ??? ????????????, ? ?? ?????? ?????,???? ????? ????????????? ???????? ?? ?? ????????. ?????????? ?? ????? ? ???????? ????????  ????????? ? ????? ?? ??? ??????????? ??????. ??? ?????.  ?????????? ?????? ??????????????? ????? ?????? ?????????????? ? ??????????? ?????????. ? ???? ??? ????????? ???????????? ???????, ?????????? ???????????? ????????? ????? ? ???????????? ? ??????????.  ???????? ???????????? ? ????????????? ?????? ???????????????? ???????? ? ?????????? ? ????? ??????? ?????????? ???????????? ?????? ????????? ??????. ??? ???????? ???????? ??????? ?????? ? ?????, ??????? ????? ????????? ? ?????????? ???????? ????????????? ????????????.  ???????? ??????????? ???????? ?? ???? ???????????? ????????? ?????? ?????? ????? ??????????, ????????: ? ???????? ????????????, ????? ?? ????? ??????????????? ????????? ??? ????????, ??? ??????. ? ????????? ?????????? ????????????? ???? ? ?????. ? ????????? ?????????, ????????? ? ????????????, ????????, ????????? ???????????? ????? ? ?????? ??? ????? ? ?????. ?????????? ? ?????, ????:  ? ??? ????????? ???? ??? ?????? ????? ?????? ??? ? ?????, ??????? ?? ?????????? ??????? ????????.  ? ??? ????????? ???????????? ?? ????? ??????????? ? ? ?????? ????? ????.  ????????????? ?????? ??????? ??? ????????? ?????? ??????? ??? ????.  ?? ?????????? ?????????????? ?????????, ?????????? ???????????? ????????. ?????????? ?????????? ? ?????, ????:  ?? ??????? ????????.  ? ??? ???????? ??????????? ??????? ????. ??? ?????????? ?? ????? ???????? ??????, ??????????????? ????? ??????. ??????????? ???????? ????? ???????????? ??? ??????? ? ????? ???????  ??????. Document Released: 10/07/2005 Document Revised: 01/29/2016 Document Reviewed: 04/05/2014 Elsevier Interactive Patient Education  2018 [employer]. ?????????????, ????? ???????? (Hysterectomy Information) ?????????????? ???????? ????????????? ???????? ?? ???????? ?????. ??? ???????? ????? ???? ????????? ? ????? ??????? ????????? ???????????. ????? ???? ???????? ? ??? ?????? ?? ????? ????????????? ????????. ????? ??? ?? ????? ?? ??????? ???????????? (???????? ????????????). ?? ????? ???? ???????? ????? ????? ???? ??????? ?????????? ????? ? ??????? (???????????? ????????????????????). ????????? ? ?????????? ?????????????  ?????????? ?????????? ????????????.  ?????????? (???????????) ???? ? ??????? ????.  ???? ?????? ????? ?????????? ???????? ????? (??????????) ???????? ????? ?? ??????? ????? (???????????).  ?????????? ???????? ???????? ? ???????? ????? ????? (?????????).  ????????? ??? ???????? ????? ?? ????????? (??????? ??????? ???????).  ????????????????? ??????????????? ? ????? (?????????? ?????), ???????????????? ???????????????? ??????????.  ????????? ??????????? ??????.  ??? ????? ????? ??? ??? ?????.  ???? ?????????????  ?????????????? ????????????? - ??? ????????????? ???????? ?? ???????? ??????? ????? ?????, ?? ?? ?? ?????.  ????????? ????????????? - ????? ? ????? ????? ????????? ?????????.  ??????????? ????????????? - ? ???? ?????? ???????? ????????? ?????, ????? ????? ? ?????????????? ?????, ??????? ?????????? ????? ? ??????? ?????? ???? (??????????).  ???? ????????????? ?? ????????????? ???????  ????????????? ????????????? - ???????? ??????? ?????? (???????) ? ?????? ??????. ????? ???? ?????? ????????? ?????.  ??????????? ????????????? - ?????? ???????? ?? ?????????. ????? ????????? ????? ?????? ??????. ??? ???? ???? ??????? ??????? ??????? ?????? ?? ?????????.  ??????? ????????????????? ????????????? - ? ??????? ?????? ?????? ??? ??? ?????? ?????????  ???????. ????? ???? ?? ???? ???????? ??????????? ?????? ?????? ? ????????? ???????????? ? ?????????? ????? ?? ????? (??????????). ????? ?????? ????????? [? ??????? ???????] ???????? ?????? ????????????? ???????????. [?????????????? ?????????????] ????? ????????? ?? ?????? ???????. ????????? ????????? ????? ??????? ????? ????????? ??????? ??? ????? ?????????.  ????????????????? ?????? ??? ??????????? ????????????? (  [facility]) - ? ??????? ?????? ?????? ??? ??? ?????? ????????? ???????. ???????? ?????????????? ????? ?????????, ???????? ??? ?????? ???????????. ????? ????????? ????? ?????????.  ???????????????? ????????????????? ????????????? - ?????????? ? ?????? ??????????? ?????? ????? 3 ??? 4 ????????? ??????? ? ??????? ??????. ??? ?????? ???????????? ??????????, ???????????? ???????????, ?????? ???????? ?????????? (3D) ???????????, ??????? ???????? ?????????????? ?????? ?????????????? ?????????????. ??? ??????? ????????? ????????????? ???????????? ??????? ????????. ????? ??????????? ?? ????????? ????????? ?????? ? ????????? ????? ????????? ??????? ??? ????? ?????????.  ????? ? ?????????? ????????? ?????????? ?????? ?????????:  ???????????? ? ?????, ????????? ? ???????????? ?????. ???????? ?????, ???? ?? ?? ???????, ????? ??? ?????? ??????????? ????? ??? ????????? ?????.  ????????? ???????? ???????? (???????) ? ?????? ?????? ??????????? ??? ??????.  ????????.  ??????????? ??????????? ???????.  ???????? ??????? ????????? ??? ???? ???????? ???????.  ??????? ? ????????????? ????????????? ????? ?????? ????????? ??????? ????????. ??? ????? ??????? ????? ?????????????  ??? ???????? ????? ?????????????? ??????????.  ????????????, ????? ????? ??? ???-?????? ????????? ? ?????? 3-5 ???? ????? ??????? ?? ????????.  ??????? ?? ????? ? ??????? ????? 2-4 ?????? ????? ????????, ????? ??????? ??????? ??????????????.  ? ??? ????? ????????? ???????? ?????? ?????????, ????????, ??????? (???????? ????), ??????????  ?? ?????, ??????????.  ???? ??? ???? ??????? ????????????? ?? ??????????, ?? ????????? ? ???????????????? ??? ? ????????????, ??????? ????? ???????? ? ????, ?? ??? ?????? ??? ????????????? ??????? ????? ?? ???????????. ???? ???? ??? ?? ????????? ?????? ?????? ????? ?? ???????????, ??? ????? ????????????? ????????? ????????? ??????? ? ??????????, ????? ??????? ??????????????? ?????? ???????????.  ??? ?????????? ?? ????? ???????? ??????, ??????????????? ????? ??????. ??????????? ???????? ????? ???????????? ??? ??????? ? ????? ??????? ??????. Document Released: 10/07/2005 Document Revised: 07/28/2013 Document Reviewed: 06/14/2013 Elsevier Interactive Patient Education  2017 [employer].

## 2018-01-23 NOTE — Progress Notes (Signed)
Subjective:     Patient ID: Evelyn Holloway, female   DOB: Aug 20, 1971, 47 y.o.   MRN: [id number]  HPI Here to review u/s report from 07/2017 done in [place](where she is from). The doctor there recommended a 6 month follow up. Please see u/s scanned in: reveals intramural nodules c/w fibroid measuring 1.2x1.1cm & 1.3x1.2cm, and signs of adenomyosis with cervical nabothian cyst. Patient has long standing h/o painful menses that have progressively gotten worse. Still occurring monthly.  Also recently had colonoscopy with multiple polyps removed (pre-cancerous) and anal polyp removed(it has regrown and is scheduled for surgery again the end of April with Dr. Richardson Landry)  Strong family history of colon polyps and colon cancer in father and father's side of the family.   Review of Systems Negative except stated above in HPI.    Objective:   Physical Exam A&Ox4 Well groomed female in no distress Blood pressure 123/84, pulse 75, height 5\' 2"  (1.575 m), weight 151 lb 14.4 oz (68.9 kg), last menstrual period 01/15/2018. PE not indicated  Pelvic u/s done today reveals:  Findings:  The uterus measures 8.5 x 6.8 x 4.7 cm. Echo texture is heterogeneous without evidence of focal masses. An anechoic area is noted within the myometrium of the fundal region measuring 0.6 x 0.7 x 0.6 cm.  A hyperechoic area is noted in the myometrium abutting the endometrium measuring 1.2 x 0.8 x 0.7 cm (? Polyp).  Multiple nabothian cysts are noted throughout the cervix. The Endometrium measures 11.0 mm and appears trilaminar. A hyperechoic area is noted within the endometrium measuring 0.5 x 0.4 x 0.3 cm.  Right Ovary measures 3.2 x 2.8 x 2.2 cm and contains a dominant follicle measuring 1.8 x 2.2 x 1.8 cm.  Left Ovary measures 3.1 x 1.5 x 1.3 cm. It is normal appearance. Survey of the adnexa demonstrates no adnexal masses. There is no free fluid in the cul de sac.  Impression: 1. Retroverted uterus appears of  normal size and contour. 2. Multiple nabothian cysts throughout the cervix. 3. Anechoic area noted in the myometrium of the fundal region measuring 0.6 x 0.7 x 0.6 cm. 4. Hyperechoic area in the myometrium abutting the endometrium measuring 1.2 x 0.8 x 0.7 cm. 5. Hyperechoic area noted within the endometrium measuring 0.5 x 0.4 x 0.3 cm. 6. The endometrium measures 11.0 mm and appears trilaminar. 7. Right ovary contains a dominant follicle measuring 1.8 x 2.2 x 1.8 cm.    Assessment:     Uterine fibroid Adenomyosis Colon polyps Family history of colon cancer in father Dysmenorrhea     Plan:     Desires discussion with surgeon regarding hysterectomy.  Will return at next available appt to discuss with one of our surgeons here.  Lior Hoen,CNM

## 2018-01-26 ENCOUNTER — Encounter
Admission: RE | Admit: 2018-01-26 | Discharge: 2018-01-26 | Disposition: A | Discharge status: Home / Self Care | Payer: BLUE CROSS/BLUE SHIELD | Source: Ambulatory Visit | Attending: General Surgery | Admitting: General Surgery

## 2018-01-26 ENCOUNTER — Telehealth: Payer: Self-pay

## 2018-01-26 ENCOUNTER — Other Ambulatory Visit: Payer: Self-pay

## 2018-01-26 HISTORY — DX: Personal history of other diseases of the digestive system: Z87.19

## 2018-01-26 HISTORY — DX: Gastro-esophageal reflux disease without esophagitis: K21.9

## 2018-01-26 HISTORY — DX: Hypothyroidism, unspecified: E03.9

## 2018-01-26 HISTORY — DX: Anxiety disorder, unspecified: F41.9

## 2018-01-26 HISTORY — DX: Inflammatory liver disease, unspecified: K75.9

## 2018-01-26 NOTE — Pre-Procedure Instructions (Signed)
Echo complete2/22/2019 [place] Component Name Value Ref Range  LV Ejection Fraction (%) 50   Aortic Valve Stenosis Grade none   Aortic Valve Regurgitation Grade none   Aortic Valve Max Velocity (m/s) 1.2 m/sec  Mitral Valve Stenosis Grade none   Mitral Valve Regurgitation Grade mild   Tricuspid Valve Regurgitation Grade mild   Tricuspid Valve Regurgitation Max Velocity (m/s) 2.4 m/sec  Right Ventricle Systolic Pressure (mmHg) 43.1 mmHg  LV End Diastolic Diameter (cm) 4.4 cm  LV End Systolic Diameter (cm) 3.2 cm  LV Septum Wall Thickness (cm) 0.93 cm  LV Posterior Wall Thickness (cm) 0.81 cm  Left Atrium Diameter (cm) 3.3 cm  Result Narrative  CARDIOLOGY Evelyn Holloway, [place] [id number] [facility] Acct #: [id number] [address] Evelyn Holloway [place], [place] [address] Date: 12/12/2017 12: 32 PM  Adult Female Age: 47 yrs ECHOCARDIOGRAM REPORTOutpatient  Evelyn Holloway  STUDY:CHEST WALL TAPE:MD1: Evelyn Holloway ECHO:YesDOPPLER:Yes FILE:BP: 120/76 mmHg  COLOR:Yes CONTRAST:YesMACHINE:Philips  RV BIOPSY:No3D:NoSOUND QLTY:ModerateHeight: 62 in MEDIUM:NoneWeight: 153 lb  BSA: 1.7 m2 _________________________________________________________________________________________ HISTORY: Chest pain  REASON: Assess, LV function  INDICATION: SOB (shortness of breath) [R06.02  (ICD-10-CM)] _________________________________________________________________________________________ ECHOCARDIOGRAPHIC MEASUREMENTS 2D DIMENSIONS AORTAValues Normal Range MAIN PA ValuesNormal Range Annulus: 1.8 cm [2.1-2.5] PA Main: nm* [1.5-2.1] Aorta Sin: 2.4 cm [2.7-3.3]RIGHT VENTRICLE ST Junction: nm*[2.3-2.9] RV Base: nm* [<4.2] Asc.Aorta: nm*[2.3-3.1]RV Mid: nm* [<3.5] LEFT VENTRICLERV Length: 2.6 cm[<8.6] LVIDd: 4.4 cm [3.9-5.3]INFERIOR VENA CAVA LVIDs: 3.2 cmMax. IVC: nm* [<=2.1]  FS: 26.7 % [>25]Min. IVC: nm* SWT: 0.93 cm[0.5-0.9]------------------ PWT: 0.81 cm[0.5-0.9]nm* - not measured LEFT ATRIUM LA Diam: 3.3 cm [2.7-3.8] LA A4C Area: nm*[<20] LA Volume: nm*[22-52] _________________________________________________________________________________________ ECHOCARDIOGRAPHIC DESCRIPTIONS AORTIC ROOT  Size: Normal  Dissection: INDETERM FOR DISSECTION AORTIC VALVE  Leaflets: Tricuspid Morphology: Normal  Mobility: Fully mobile LEFT VENTRICLE  Size: NormalAnterior: Normal Contraction: Normal Lateral: Normal  Closest EF: 50% (Estimated) Septal: Normal LV Masses: No Masses Apical: Normal LVH: NoneInferior: Normal Posterior: Normal  Dias.FxClass:  N/A MITRAL VALVE  Leaflets: NormalMobility: Fully mobile  Morphology: Normal LEFT ATRIUM  Size: Normal LA Masses: No masses MAIN PA  Size: Normal PULMONIC VALVE  Leaflets: N/A Morphology: Normal  Mobility: Fully mobile RIGHT VENTRICLE RV Masses: No Masses Size: Normal Free Wall: Normal Contraction: Normal TRICUSPID VALVE  Leaflets: NormalMobility: Fully mobile  Morphology: Normal RIGHT ATRIUM  Size: NormalRA Other: None RA Mass: No masses PERICARDIUM Fluid: No effusion INFERIOR VENACAVA  Size: Normal Normal respiratory collapse _________________________________________________________________________________________  DOPPLER ECHO and OTHER SPECIAL PROCEDURES  Aortic: No ARNo AS  118.6 cm/sec peak vel5.6 mmHg peak grad  Mitral: MILD MRNo MS  2.4 cm^2 by DOPPLER  MV Inflow E Vel = 90.7 cm/sec MV Annulus E'Vel = 8.9 cm/sec  E/E'Ratio = 10.2 Tricuspid: MILD TRNo TS  244.3 cm/sec peak TR vel 28.9 mmHg peak RV pressure Pulmonary: TRIVIAL PR No PS  104.5 cm/sec peak vel4.4 mmHg peak grad _________________________________________________________________________________________ INTERPRETATION NORMAL LEFT VENTRICULAR SYSTOLIC FUNCTION NORMAL RIGHT VENTRICULAR SYSTOLIC FUNCTION MILD VALVULAR REGURGITATION (See above) NO VALVULAR STENOSIS MILD MR, TR TRIVIAL PR EF  50-55% _________________________________________________________________________________________ Electronically signed Evelyn Benton, MD on 12/12/2017 02: 10 PM  Performed By: Evelyn Holloway, RDCS  Ordering Physician: Evelyn Holloway _________________________________________________________________________________________  Other Result Information  Interface, Text Results In - 12/12/2017  2:10 PM EST                     CARDIOLOGY DEPARTMENT  [facility]                                    [id number]           [place] #: [id number]           [address] Evelyn Holloway [place], [place] [address]       Date: 12/12/2017 12: 15 PM                                                              Adult   Female   Age: 21 yrs           ECHOCARDIOGRAM REPORT                              Outpatient                                                              [facility]      STUDY:CHEST WALL               TAPE:                    MD1: Evelyn Holloway       ECHO:Yes    DOPPLER:Yes       FILE:                    BP: 120/76 mmHg      COLOR:Yes   CONTRAST:Yes    MACHINE:Philips  RV BIOPSY:No          3D:No  SOUND QLTY:Moderate            Height: 62 in     MEDIUM:None                                              Weight: 153 lb                                                              BSA: 1.7 m2 _________________________________________________________________________________________               HISTORY: Chest pain                REASON: Assess, LV function            INDICATION: SOB (shortness of breath) [R06.02 (ICD-10-CM)] _________________________________________________________________________________________ ECHOCARDIOGRAPHIC MEASUREMENTS 2D DIMENSIONS  AORTA                  Values   Normal Range   MAIN PA         Values    Normal Range               Annulus: 1.8 cm       [2.1-2.5]          PA Main: nm*       [1.5-2.1]             Aorta Sin: 2.4 cm       [2.7-3.3]    RIGHT VENTRICLE           ST Junction: nm*          [2.3-2.9]         RV Base: nm*       [<4.2]             Asc.Aorta: nm*          [2.3-3.1]          RV Mid: nm*       [<3.5] LEFT VENTRICLE                                      RV Length: 2.6 cm    [<8.6]                 LVIDd: 4.4 cm       [3.9-5.3]    INFERIOR VENA CAVA                 LVIDs: 3.2 cm                        Max. IVC: nm*       [<=2.1]                    FS: 26.7 %       [>25]            Min. IVC: nm*                   SWT: 0.93 cm      [0.5-0.9]    ------------------                   PWT: 0.81 cm      [0.5-0.9]    nm* - not measured LEFT ATRIUM               LA Diam: 3.3 cm       [2.7-3.8]           LA A4C Area: nm*          [<20]             LA Volume: nm*          [22-52] _________________________________________________________________________________________ ECHOCARDIOGRAPHIC DESCRIPTIONS AORTIC ROOT                  Size: Normal            Dissection: INDETERM FOR DISSECTION AORTIC VALVE              Leaflets: Tricuspid                   Morphology: Normal  Mobility: Fully mobile LEFT VENTRICLE                  Size: Normal                        Anterior: Normal           Contraction: Normal                         Lateral: Normal            Closest EF: 50% (Estimated)                 Septal: Normal             LV Masses: No Masses                       Apical: Normal                   LVH: None                          Inferior: Normal                                                     Posterior: Normal          Dias.FxClass: N/A MITRAL VALVE              Leaflets: Normal                        Mobility: Fully mobile            Morphology: Normal LEFT ATRIUM                  Size: Normal                       LA Masses: No masses MAIN PA                  Size: Normal PULMONIC VALVE              Leaflets: N/A                          Morphology: Normal              Mobility: Fully mobile RIGHT VENTRICLE             RV Masses: No Masses                         Size: Normal             Free Wall: Normal                     Contraction: Normal TRICUSPID VALVE              Leaflets: Normal                        Mobility: Fully mobile            Morphology: Normal RIGHT ATRIUM  Size: Normal                        RA Other: None               RA Mass: No masses PERICARDIUM                 Fluid: No effusion INFERIOR VENACAVA                  Size: Normal Normal respiratory collapse _________________________________________________________________________________________  DOPPLER ECHO and OTHER SPECIAL PROCEDURES                Aortic: No AR                      No AS                        118.6 cm/sec peak vel      5.6 mmHg peak grad                Mitral: MILD MR                    No MS                        2.4 cm^2 by DOPPLER                        MV Inflow E Vel = 90.7 cm/sec     MV Annulus E'Vel = 8.9 cm/sec                        E/E'Ratio = 10.2             Tricuspid: MILD TR                    No TS                        244.3 cm/sec peak TR vel   28.9 mmHg peak RV pressure             Pulmonary: TRIVIAL PR                 No PS                        104.5 cm/sec peak vel      4.4 mmHg peak grad _________________________________________________________________________________________ INTERPRETATION NORMAL LEFT VENTRICULAR SYSTOLIC FUNCTION NORMAL RIGHT VENTRICULAR SYSTOLIC FUNCTION MILD VALVULAR REGURGITATION (See above) NO VALVULAR STENOSIS MILD MR, TR TRIVIAL PR EF 50-55% _________________________________________________________________________________________ Electronically signed by    Evelyn Amel, MD on 12/12/2017 02: 10 PM          Performed By: Evelyn Holloway, [place]    Ordering Physician: Evelyn Holloway

## 2018-01-26 NOTE — Patient Instructions (Signed)
Your procedure is scheduled on: 01-30-18 Report to Same Day Surgery 2nd floor medical mall [facility] Entrance-take elevator on left to 2nd floor.  Check in with surgery information desk.) To find out your arrival time please call [phone] between 1PM - 3PM on 01-29-18  Remember: Instructions that are not followed completely may result in serious medical risk, up to and including death, or upon the discretion of your surgeon and anesthesiologist your surgery may need to be rescheduled.    _x___ 1. Do not eat food after midnight the night before your procedure. You may drink clear liquids up to 2 hours before you are scheduled to arrive at the hospital for your procedure.  Do not drink clear liquids within 2 hours of your scheduled arrival to the hospital.  Clear liquids include  --Water or Apple juice without pulp  --Clear carbohydrate beverage such as ClearFast or Gatorade  --Black Coffee or Clear Tea (No milk, no creamers, do not add anything to the coffee or Tea   No gum chewing or hard candies.     __x__ 2. No Alcohol for 24 hours before or after surgery.   __x__3. No Smoking or e-cigarettes for 24 prior to surgery.  Do not use any chewable tobacco products for at least 6 hour prior to surgery   ____  4. Bring all medications with you on the day of surgery if instructed.    __x__ 5. Notify your doctor if there is any change in your medical condition     (cold, fever, infections).    x___6. On the morning of surgery brush your teeth with toothpaste and water.  You may rinse your mouth with mouth wash if you wish.  Do not swallow any toothpaste or mouthwash.   Do not wear jewelry, make-up, hairpins, clips or nail polish.  Do not wear lotions, powders, or perfumes. You may wear deodorant.  Do not shave 48 hours prior to surgery. Men may shave face and neck.  Do not bring valuables to the hospital.    [facility] is not responsible for any belongings or valuables.      Contacts, dentures or bridgework may not be worn into surgery.  Leave your suitcase in the car. After surgery it may be brought to your room.  For patients admitted to the hospital, discharge time is determined by your                       treatment team.  _  Patients discharged the day of surgery will not be allowed to drive home.  You will need someone to drive you home and stay with you the night of your procedure.    Please read over the following fact sheets that you were given:   [facility] Preparing for Surgery and or MRSA Information   _x___ Take anti-hypertensive listed below, cardiac, seizure, asthma, anti-reflux and psychiatric medicines. These include:  1. levothyroxine  2.  3.  4.  5.  6.  ____Fleets enema or Magnesium Citrate as directed.   ____ Use CHG Soap or sage wipes as directed on instruction sheet   ____ Use inhalers on the day of surgery and bring to hospital day of surgery  ____ Stop Metformin and Janumet 2 days prior to surgery.    ____ Take 1/2 of usual insulin dose the night before surgery and none on the morning     surgery.   ____ Follow recommendations from Cardiologist, Pulmonologist  or PCP regarding stopping Aspirin, Coumadin, Plavix ,Eliquis, Effient, or Pradaxa, and Pletal.  ____Stop Anti-inflammatories such as Advil, Aleve, Ibuprofen, Motrin, Naproxen, Naprosyn, Goodies powders or aspirin products. OK to take Tylenol    ____ Stop supplements until after surgery.    ____ Bring C-Pap to the hospital.

## 2018-01-26 NOTE — Telephone Encounter (Signed)
Call to patient to let her know that an earlier date has open for her surgery. The patient is now scheduled for surgery at [facility] on 01/30/18. She will pre admit by phone on 01/26/18. The patient is aware of date, time, and instructions.
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
## 2018-01-27 ENCOUNTER — Encounter: Payer: Self-pay | Admitting: Obstetrics and Gynecology

## 2018-01-27 ENCOUNTER — Ambulatory Visit (INDEPENDENT_AMBULATORY_CARE_PROVIDER_SITE_OTHER): Payer: BLUE CROSS/BLUE SHIELD | Admitting: Obstetrics and Gynecology

## 2018-01-27 VITALS — BP 125/77 | HR 84 | Ht 62.0 in | Wt 153.5 lb

## 2018-01-27 DIAGNOSIS — D251 Intramural leiomyoma of uterus: Secondary | ICD-10-CM | POA: Diagnosis not present

## 2018-01-27 DIAGNOSIS — N8000 Endometriosis of the uterus, unspecified: Secondary | ICD-10-CM

## 2018-01-27 DIAGNOSIS — N941 Unspecified dyspareunia: Secondary | ICD-10-CM | POA: Diagnosis not present

## 2018-01-27 DIAGNOSIS — N946 Dysmenorrhea, unspecified: Secondary | ICD-10-CM

## 2018-01-27 NOTE — Patient Instructions (Addendum)
1.  Laparoscopy with peritoneal biopsies is to be performed on Friday, 01/30/2018 2.  Postop appointment will be scheduled 10 days after surgery   Endometriosis Endometriosis is a condition in which the tissue that lines the uterus (endometrium) grows outside of its normal location. The tissue may grow in many locations close to the uterus, but it commonly grows on the ovaries, fallopian tubes, vagina, or bowel. When the uterus sheds the endometrium every menstrual cycle, there is bleeding wherever the endometrial tissue is located. This can cause pain because blood is irritating to tissues that are not normally exposed to it. What are the causes? The cause of endometriosis is not known. What increases the risk? You may be more likely to develop endometriosis if you:  Have a family history of endometriosis.  Have never given birth.  Started your period at age 69 or younger.  Have high levels of estrogen in your body.  Were exposed to a certain medicine (diethylstilbestrol) before you were born (in utero).  Had low birth weight.  Were born as a twin, triplet, or other multiple.  Have a BMI of less than 25. BMI is an estimate of body fat and is calculated from height and weight.  What are the signs or symptoms? Often, there are no symptoms of this condition. If you do have symptoms, they may:  Vary depending on where your endometrial tissue is growing.  Occur during your menstrual period (most common) or midcycle.  Come and go, or you may go months with no symptoms at all.  Stop with menopause.  Symptoms may include:  Pain in the back or abdomen.  Heavier bleeding during periods.  Pain during sex.  Painful bowel movements.  Infertility.  Pelvic pain.  Bleeding more than once a month.  How is this diagnosed? This condition is diagnosed based on your symptoms and a physical exam. You may have tests, such as:  Blood tests and urine tests. These may be done to help  rule out other possible causes of your symptoms.  Ultrasound, to look for abnormal tissues.  An X-ray of the lower bowel (barium enema).  An ultrasound that is done through the vagina (transvaginally).  CT scan.  MRI.  Laparoscopy. In this procedure, a lighted, pencil-sized instrument called a laparoscope is inserted into your abdomen through an incision. The laparoscope allows your health care provider to look at the organs inside your body and check for abnormal tissue to confirm the diagnosis. If abnormal tissue is found, your health care provider may remove a small piece of tissue (biopsy) to be examined under a microscope.  How is this treated? Treatment for this condition may include:  Medicines to relieve pain, such as NSAIDs.  Hormone therapy. This involves using artificial (synthetic) hormones to reduce endometrial tissue growth. Your health care provider may recommend using a hormonal form of birth control, or other medicines.  Surgery. This may be done to remove abnormal endometrial tissue. ? In some cases, tissue may be removed using a laparoscope and a laser (laparoscopic laser treatment). ? In severe cases, surgery may be done to remove the fallopian tubes, uterus, and ovaries (hysterectomy).  Follow these instructions at home:  Take over-the-counter and prescription medicines only as told by your health care provider.  Do not drive or use heavy machinery while taking prescription pain medicine.  Try to avoid activities that cause pain, including sexual activity.  Keep all follow-up visits as told by your health care provider. This is  important. Contact a health care provider if:  You have pain in the area between your hip bones (pelvic area) that occurs: ? Before, during, or after your period. ? In between your period and gets worse during your period. ? During or after sex. ? With bowel movements or urination, especially during your period.  You have problems  getting pregnant.  You have a fever. Get help right away if:  You have severe pain that does not get better with medicine.  You have severe nausea and vomiting, or you cannot eat without vomiting.  You have pain that affects only the lower, right side of your abdomen.  You have abdominal pain that gets worse.  You have abdominal swelling.  You have blood in your stool. This information is not intended to replace advice given to you by your health care provider. Make sure you discuss any questions you have with your health care provider. Document Released: 10/04/2000 Document Revised: 07/12/2016 Document Reviewed: 03/09/2016 Elsevier Interactive Patient Education  2018 [employer]. Diagnostic Laparoscopy A diagnostic laparoscopy is a procedure to diagnose diseases in the abdomen. During the procedure, a thin, lighted, pencil-sized instrument called a laparoscope is inserted into the abdomen through an incision. The laparoscope allows your health care provider to look at the organs inside your body. Tell a health care provider about:  Any allergies you have.  All medicines you are taking, including vitamins, herbs, eye drops, creams, and over-the-counter medicines.  Any problems you or family members have had with anesthetic medicines.  Any blood disorders you have.  Any surgeries you have had.  Any medical conditions you have. What are the risks? Generally, this is a safe procedure. However, problems can occur, which may include:  Infection.  Bleeding.  Damage to other organs.  Allergic reaction to the anesthetics used during the procedure.  What happens before the procedure?  Do not eat or drink anything after midnight on the night before the procedure or as directed by your health care provider.  Ask your health care provider about: ? Changing or stopping your regular medicines. ? Taking medicines such as aspirin and ibuprofen. These medicines can thin your  blood. Do not take these medicines before your procedure if your health care provider instructs you not to.  Plan to have someone take you home after the procedure. What happens during the procedure?  You may be given a medicine to help you relax (sedative).  You will be given a medicine to make you sleep (general anesthetic).  Your abdomen will be inflated with a gas. This will make your organs easier to see.  Small incisions will be made in your abdomen.  A laparoscope and other small instruments will be inserted into the abdomen through the incisions.  A tissue sample may be removed from an organ in the abdomen for examination.  The instruments will be removed from the abdomen.  The gas will be released.  The incisions will be closed with stitches (sutures). What happens after the procedure? Your blood pressure, heart rate, breathing rate, and blood oxygen level will be monitored often until the medicines you were given have worn off. This information is not intended to replace advice given to you by your health care provider. Make sure you discuss any questions you have with your health care provider. Document Released: 01/13/2001 Document Revised: 02/15/2016 Document Reviewed: 05/20/2014 Elsevier Interactive Patient Education  [employer].

## 2018-01-27 NOTE — H&P (Signed)
Preoperative scratch that PREOPERATIVE HISTORY AND PHYSICAL  Date of surgery: 01/30/2018 Diagnosis: 1.  Severe dysmenorrhea 2.  Chronic pelvic pain 3.  Suspected endometriosis/adenomyosis 4.  Uterine fibroids   Patient is a 47 y.o. [id number] scheduled for laparoscopy with peritoneal biopsies on 01/30/2018. Patient has clinical history and physical suspicious for endometriosis/adenomyosis.   Workup in [place] included pelvic ultrasound 6 months ago that showed uterine fibroids and changes consistent with adenomyosis  Gynecologic history: Menarche-age 24 Intervals-every 3-4 weeks Duration-3-7 days Flow is heavy with clots; history of anemia as a teenager Long history of severe dysmenorrhea 100 out of a scale of 1-10!;  Patient has missed school and work because of severe pain; she has passed out from her cramps Dysmenorrhea is characterized as central cramping with occasional left greater than right component; she does have perimenstrual low backache with radiation into her thighs Long history of deep thrusting dyspareunia, getting worse Long history of abnormal Pap smears; Pap/HPV negative/negative (06/17/2017)  Pelvic u/s done 01/23/2018 reveals: Findings:  The uterus measures 8.5 x 6.8 x 4.7 cm. Echo texture is heterogeneous without evidence of focal masses. An anechoic area is noted within the myometrium of the fundal region measuring 0.6 x 0.7 x 0.6 cm.  A hyperechoic area is noted in the myometrium abutting the endometrium measuring 1.2 x 0.8 x 0.7 cm (? Polyp).  Multiple nabothian cysts are noted throughout the cervix. The Endometrium measures 11.0 mm and appears trilaminar. A hyperechoic area is noted within the endometrium measuring 0.5 x 0.4 x 0.3 cm.  Right Ovary measures 3.2 x 2.8 x 2.2 cm and contains a dominant follicle measuring 1.8 x 2.2 x 1.8 cm.  Left Ovary measures 3.1 x 1.5 x 1.3 cm. It is normal appearance. Survey of the adnexa demonstrates no adnexal masses. There  is no free fluid in the cul de sac.    OB History    Gravida  3   Para  1   Term  1   Preterm      AB  2   Living  1     SAB      TAB  2   Ectopic      Multiple      Live Births  1        Obstetric Comments  1st Menstrual Cycle:  13 1st Pregnancy:  21          Patient's last menstrual period was 01/15/2018 (exact date).    Past Medical History:  Diagnosis Date  . Anxiety   . Colon polyp   . GERD (gastroesophageal reflux disease)     TUMS PRN  . Heavy periods   . 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 History of hiatal hernia   . Hyperlipidemia   . Hypothyroidism   . Irregular menses   . Pelvic pain in female   . Thyroid disease   . Vaginal Pap smear, abnormal   . Vitamin B12 deficiency anemia due to intrinsic factor deficiency     Past Surgical History:  Procedure Laterality Date  . BACK SURGERY  11/2017   Eduardo Osier  . CHOLECYSTECTOMY    . COLONOSCOPY  07/2017   in [place]/ colon polyps  . COLONOSCOPY  01/08/2018   Dr Vira Agar  . COLPOSCOPY    . GALLBLADDER SURGERY  2010  . HEMORRHOID SURGERY  07/2017   in [place]    OB History  Gravida Para Term Preterm AB Living  3 1 1  2 1  SAB TAB Ectopic Multiple Live Births    2     1    # Outcome Date GA Lbr Len/2nd Weight Sex Delivery Anes PTL Lv  3 Term 1995   6 lb 2.8 oz (2.8 kg) F Vag-Spont   LIV  2 TAB 1994          1 TAB 1993            Obstetric Comments  1st Menstrual Cycle:  13  1st Pregnancy:  21    Social History   Socioeconomic History  . Marital status: Married    Spouse name: Not on file  . Number of children: Not on file  . Years of education: Not on file  . Highest education level: Not on file  Occupational History  . Not on file  Social Needs  . Financial resource strain: Not on file  . Food insecurity:    Worry: Not on file    Inability: Not on file  . Transportation needs:    Medical: Not on file    Non-medical: Not on file  Tobacco Use  . Smoking status:  Former Smoker    Packs/day: 0.50    Years: 23.00    Pack years: 11.50    Types: Cigarettes    Last attempt to quit: 01/27/2016    Years since quitting: 2.0  . Smokeless tobacco: Never Used  Substance and Sexual Activity  . Alcohol use: Yes    Comment: occas  . Drug use: No  . Sexual activity: Yes    Comment: vasectomy  Lifestyle  . Physical activity:    Days per week: Not on file    Minutes per session: Not on file  . Stress: Not on file  Relationships  . Social connections:    Talks on phone: Not on file    Gets together: Not on file    Attends religious service: Not on file    Active member of club or organization: Not on file    Attends meetings of clubs or organizations: Not on file    Relationship status: Not on file  Other Topics Concern  . Not on file  Social History Narrative  . Not on file    Family History  Problem Relation Age of Onset  . Cancer Maternal Grandfather        colon  . Hypertension Mother   . Thyroid disease Maternal Grandmother   . Diabetes Neg Hx   . Heart disease Neg Hx      (Not in a hospital admission)  No Known Allergies  Review of Systems Constitutional: No recent fever/chills/sweats Respiratory: No recent cough/bronchitis Cardiovascular: No chest pain Gastrointestinal: No recent nausea/vomiting/diarrhea Genitourinary: No UTI symptoms Hematologic/lymphatic:No history of coagulopathy or recent blood thinner use    Objective:    BP 125/77   Pulse 84   Ht 5\' 2"  (1.575 m)   Wt 153 lb 8 oz (69.6 kg)   LMP 01/15/2018 (Exact Date)   BMI 28.08 kg/m   General:   Normal  Skin:   normal  HEENT:  Normal  Neck:  Supple without Adenopathy or Thyromegaly  Lungs:   Heart:              Breasts:   Abdomen:  Pelvis:  M/S   Extremeties:  Neuro:    clear to auscultation bilaterally   Normal without murmur   Not Examined   soft, non-tender; bowel sounds normal; no masses,  no organomegaly   Exam deferred to OR  No CVAT   Warm/Dry   Normal          Assessment:    1.  Severe dysmenorrhea and chronic pelvic pain 2.  Suspected endometriosis/adenomyosis 3.  History of uterine fibroids   Plan:  Laparoscopy with peritoneal biopsies, and probable excision and fulguration of endometriosis  Preop counseling: Patient is to undergo laparoscopy with peritoneal biopsies and probable excision and fulguration of endometriosis on 01/30/2018.  She is understanding of the planned procedures and is aware of and is accepting of all surgical risks which include but are not limited to bleeding, infection, pelvic organ injury with need for repair, blood clot disorders, anesthesia risk, etc.  All questions have been answered.  Informed consent is given.  Patient is very willing to proceed with surgery as scheduled.  This case is to follow Dr. Merry Proud Burnett's excision of anal polyp.  Brayton Mars, MD  Note: This dictation was prepared with Dragon dictation along with smaller phrase technology. Any transcriptional errors that result from this process are unintentional.

## 2018-01-27 NOTE — Progress Notes (Signed)
GYN ENCOUNTER NOTE  Subjective:       Evelyn Holloway is a 47 y.o. [phone] female is here for gynecologic evaluation of the following issues:  1.  Endometriosis/adenomyosis.   2.  Uterine fibroids  47 year old [place] female para 1021, using vasectomy for contraception, presents in referral from [facility] for further evaluation and management of probable symptomatic endometriosis. Workup in [place] included pelvic ultrasound 6 months ago that showed uterine fibroids and changes consistent with adenomyosis  Gynecologic history: Menarche-age 61 Intervals-every 3-4 weeks Duration-3-7 days Flow is heavy with clots; history of anemia as a teenager Long history of severe dysmenorrhea 100 out of a scale of 1-10!;  Patient has missed school and work because of severe pain; she has passed out from her cramps Dysmenorrhea is characterized as central cramping with occasional left greater than right component; she does have perimenstrual low backache with radiation into her thighs Long history of deep thrusting dyspareunia, getting worse Long history of abnormal Pap smears; Pap/HPV negative/negative (06/17/2017)   Obstetric History OB History  Gravida Para Term Preterm AB Living  3 1 1   2 1   SAB TAB Ectopic Multiple Live Births    2     1    # Outcome Date GA Lbr Len/2nd Weight Sex Delivery Anes PTL Lv  3 Term 1995   6 lb 2.8 oz (2.8 kg) F Vag-Spont   LIV  2 TAB 1994          1 TAB 1993            Obstetric Comments  1st Menstrual Cycle:  13  1st Pregnancy:  21    Past Medical History:  Diagnosis Date  . Anxiety   . Colon polyp   . GERD (gastroesophageal reflux disease)     TUMS PRN  . Heavy periods   . 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 History of hiatal hernia   . Hyperlipidemia   . Hypothyroidism   . Irregular menses   . Pelvic pain in female   . Thyroid disease   . Vaginal Pap smear, abnormal   . Vitamin B12 deficiency anemia due to intrinsic factor deficiency      Past Surgical History:  Procedure Laterality Date  . BACK SURGERY  11/2017   Eduardo Osier  . CHOLECYSTECTOMY    . COLONOSCOPY  07/2017   in [place]/ colon polyps  . COLONOSCOPY  01/08/2018   Dr Vira Agar  . COLPOSCOPY    . GALLBLADDER SURGERY  2010  . HEMORRHOID SURGERY  07/2017   in [place]    Current Outpatient Medications on File Prior to Visit  Medication Sig Dispense Refill  . cyclobenzaprine (FLEXERIL) 10 MG tablet Take 10 mg by mouth as needed.     . Diclofenac Sodium 3 % GEL Place 1 application onto the skin every 12 (twelve) hours as needed (pain). 50 g 0  . FLUoxetine (PROZAC) 10 MG tablet Take 10 mg by mouth every evening.     Marland Kitchen levothyroxine (SYNTHROID, LEVOTHROID) 25 MCG tablet TAKE 1 TABLET (25 MCG TOTAL) BY MOUTH DAILY BEFORE BREAKFAST. 30 tablet 3  . Multiple Vitamins-Minerals (MULTIVITAMIN WITH MINERALS) tablet Take 1 tablet by mouth daily.    . naproxen (NAPROSYN) 500 MG tablet Take 500 mg by mouth daily as needed for moderate pain.     . Vitamin D, Ergocalciferol, (DRISDOL) 50000 units CAPS capsule TAKE 1 CAPSULE BY MOUTH ONCE A WEEK. (Patient taking differently: TAKE 1 CAPSULE BY  MOUTH ONCE A WEEK. Monday) 30 capsule 3   No current facility-administered medications on file prior to visit.     No Known Allergies  Social History   Socioeconomic History  . Marital status: Married    Spouse name: Not on file  . Number of children: Not on file  . Years of education: Not on file  . Highest education level: Not on file  Occupational History  . Not on file  Social Needs  . Financial resource strain: Not on file  . Food insecurity:    Worry: Not on file    Inability: Not on file  . Transportation needs:    Medical: Not on file    Non-medical: Not on file  Tobacco Use  . Smoking status: Former Smoker    Packs/day: 0.50    Years: 23.00    Pack years: 11.50    Types: Cigarettes    Last attempt to quit: 01/27/2016    Years since quitting: 2.0  .  Smokeless tobacco: Never Used  Substance and Sexual Activity  . Alcohol use: Yes    Comment: occas  . Drug use: No  . Sexual activity: Yes    Comment: vasectomy  Lifestyle  . Physical activity:    Days per week: Not on file    Minutes per session: Not on file  . Stress: Not on file  Relationships  . Social connections:    Talks on phone: Not on file    Gets together: Not on file    Attends religious service: Not on file    Active member of club or organization: Not on file    Attends meetings of clubs or organizations: Not on file    Relationship status: Not on file  . Intimate partner violence:    Fear of current or ex partner: Not on file    Emotionally abused: Not on file    Physically abused: Not on file    Forced sexual activity: Not on file  Other Topics Concern  . Not on file  Social History Narrative  . Not on file    Family History  Problem Relation Age of Onset  . Cancer Maternal Grandfather        colon  . Hypertension Mother   . Thyroid disease Maternal Grandmother   . Diabetes Neg Hx   . Heart disease Neg Hx     The following portions of the patient's history were reviewed and updated as appropriate: allergies, current medications, past family history, past medical history, past social history, past surgical history and problem list.  Review of Systems Review of Systems  Constitutional: Negative for chills, fever and malaise/fatigue.  Eyes: Negative.   Respiratory: Negative.   Cardiovascular: Negative.   Gastrointestinal: Positive for diarrhea.       Diarrhea noted around menses  Genitourinary:       Urinary frequency and urgency noted around menses  Musculoskeletal: Positive for back pain.  Skin: Negative.   Neurological: Negative.   Endo/Heme/Allergies: Negative.   Psychiatric/Behavioral: Negative.      Objective:   BP 125/77   Pulse 84   Ht 5\' 2"  (1.575 m)   Wt 153 lb 8 oz (69.6 kg)   LMP 01/15/2018 (Exact Date)   BMI 28.08 kg/m   CONSTITUTIONAL: Well-developed, well-nourished female in no acute distress.  HENT:  Normocephalic, atraumatic.  NECK: Normal range of motion, supple, no masses.  Normal thyroid.  SKIN: Skin is warm and dry. No rash noted.  Not diaphoretic. No erythema. No pallor. [place]: Alert and oriented to person, place, and time. PSYCHIATRIC: Normal mood and affect. Normal behavior. Normal judgment and thought content. CARDIOVASCULAR:Not Examined RESPIRATORY: Not Examined BREASTS: Not Examined ABDOMEN: Soft, non distended; Non tender.  No Organomegaly. PELVIC:  External Genitalia: Normal  BUS: Normal  Vagina: Normal  Cervix: Parous; nabothian cyst 9:00; cervical motion tenderness 1/4  Uterus: Retroverted, top normal size, nodular, tender 2/4, mobile  Adnexa: Normal; nonpalpable nontender  RV: Normal external exam  Bladder: Nontender MUSCULOSKELETAL: Normal range of motion. No tenderness.  No cyanosis, clubbing, or edema.     Assessment:   1. Dysmenorrhea  2. Uterus, adenomyosis  3. Intramural leiomyoma of uterus  4. Dyspareunia, female     Plan:   1.  Evaluation and management options for endometriosis/adenomyosis, both medical and surgical, have been reviewed with patient and spouse. 2.  Laparoscopy with peritoneal biopsies is scheduled for 01/30/2018 (to be added on to Dr. Merry Proud Burnett's rectal polyp excision case on Friday) 3.  Patient is to return for postop check 02/10/2018 4.  Literature regarding laparoscopy and endometriosis is given to the patient.  A total of 25 minutes were spent face-to-face with the patient during this encounter and over half of that time involved counseling and coordination of care.  Brayton Mars, MD  Note: This dictation was prepared with Dragon dictation along with smaller phrase technology. Any transcriptional errors that result from this process are unintentional.

## 2018-01-28 ENCOUNTER — Encounter: Payer: Self-pay | Admitting: General Surgery

## 2018-01-28 NOTE — Progress Notes (Signed)
Patient provided records from her July 25, 2017 upper and lower endoscopy completed in [place], [place].  She was found to have 3 polyps in the colon hyperplastic polyp at the appendiceal orifice, tubular adenoma of the ascending colon 5 mm and a tubular adenoma in the ascending colon measuring 3 mm.  An anal polyp reportedly excised operatively was reported as a fibroepithelial anal polyp.

## 2018-01-30 ENCOUNTER — Ambulatory Visit: Payer: BLUE CROSS/BLUE SHIELD | Admitting: Certified Registered Nurse Anesthetist

## 2018-01-30 ENCOUNTER — Encounter
Admission: RE | Disposition: A | Discharge status: Home / Self Care | Payer: Self-pay | Source: Ambulatory Visit | Attending: General Surgery

## 2018-01-30 ENCOUNTER — Ambulatory Visit
Admission: RE | Admit: 2018-01-30 | Discharge: 2018-01-30 | Disposition: A | Discharge status: Home / Self Care | Payer: BLUE CROSS/BLUE SHIELD | Source: Ambulatory Visit | Attending: General Surgery | Admitting: General Surgery

## 2018-01-30 ENCOUNTER — Other Ambulatory Visit: Payer: Self-pay

## 2018-01-30 ENCOUNTER — Inpatient Hospital Stay: Admission: RE | Admit: 2018-01-30 | Payer: Self-pay | Source: Ambulatory Visit

## 2018-01-30 DIAGNOSIS — D688 Other specified coagulation defects: Secondary | ICD-10-CM | POA: Diagnosis not present

## 2018-01-30 DIAGNOSIS — K449 Diaphragmatic hernia without obstruction or gangrene: Secondary | ICD-10-CM | POA: Diagnosis not present

## 2018-01-30 DIAGNOSIS — Z9049 Acquired absence of other specified parts of digestive tract: Secondary | ICD-10-CM | POA: Insufficient documentation

## 2018-01-30 DIAGNOSIS — Z87891 Personal history of nicotine dependence: Secondary | ICD-10-CM | POA: Insufficient documentation

## 2018-01-30 DIAGNOSIS — E039 Hypothyroidism, unspecified: Secondary | ICD-10-CM | POA: Insufficient documentation

## 2018-01-30 DIAGNOSIS — N9419 Other specified dyspareunia: Secondary | ICD-10-CM | POA: Diagnosis present

## 2018-01-30 DIAGNOSIS — Z86010 Personal history of colonic polyps: Secondary | ICD-10-CM | POA: Insufficient documentation

## 2018-01-30 DIAGNOSIS — E785 Hyperlipidemia, unspecified: Secondary | ICD-10-CM | POA: Insufficient documentation

## 2018-01-30 DIAGNOSIS — N809 Endometriosis, unspecified: Secondary | ICD-10-CM | POA: Diagnosis not present

## 2018-01-30 DIAGNOSIS — K620 Anal polyp: Secondary | ICD-10-CM | POA: Insufficient documentation

## 2018-01-30 DIAGNOSIS — E538 Deficiency of other specified B group vitamins: Secondary | ICD-10-CM | POA: Insufficient documentation

## 2018-01-30 DIAGNOSIS — D259 Leiomyoma of uterus, unspecified: Secondary | ICD-10-CM | POA: Diagnosis not present

## 2018-01-30 DIAGNOSIS — F419 Anxiety disorder, unspecified: Secondary | ICD-10-CM | POA: Insufficient documentation

## 2018-01-30 DIAGNOSIS — K219 Gastro-esophageal reflux disease without esophagitis: Secondary | ICD-10-CM | POA: Diagnosis not present

## 2018-01-30 DIAGNOSIS — Z9889 Other specified postprocedural states: Secondary | ICD-10-CM

## 2018-01-30 HISTORY — PX: PILONIDAL CYST EXCISION: SHX744

## 2018-01-30 HISTORY — PX: LAPAROSCOPY: SHX197

## 2018-01-30 LAB — CBC WITH DIFFERENTIAL/PLATELET
Basophils Absolute: 0 10*3/uL (ref 0–0.1)
Basophils Relative: 1 %
EOS PCT: 4 %
Eosinophils Absolute: 0.3 10*3/uL (ref 0–0.7)
HCT: 43.7 % (ref 35.0–47.0)
Hemoglobin: 14.9 g/dL (ref 12.0–16.0)
LYMPHS ABS: 2 10*3/uL (ref 1.0–3.6)
LYMPHS PCT: 22 %
MCH: 31.7 pg (ref 26.0–34.0)
MCHC: 34 g/dL (ref 32.0–36.0)
MCV: 93.3 fL (ref 80.0–100.0)
MONO ABS: 0.8 10*3/uL (ref 0.2–0.9)
MONOS PCT: 9 %
Neutro Abs: 5.9 10*3/uL (ref 1.4–6.5)
Neutrophils Relative %: 66 %
PLATELETS: 241 10*3/uL (ref 150–440)
RBC: 4.68 MIL/uL (ref 3.80–5.20)
RDW: 12.5 % (ref 11.5–14.5)
WBC: 9 10*3/uL (ref 3.6–11.0)

## 2018-01-30 LAB — TYPE AND SCREEN
ABO/RH(D): O POS
Antibody Screen: NEGATIVE

## 2018-01-30 LAB — POCT PREGNANCY, URINE: PREG TEST UR: NEGATIVE

## 2018-01-30 LAB — ABO/RH: ABO/RH(D): O POS

## 2018-01-30 LAB — RAPID HIV SCREEN (HIV 1/2 AB+AG)
HIV 1/2 Antibodies: NONREACTIVE
HIV-1 P24 Antigen - HIV24: NONREACTIVE

## 2018-01-30 SURGERY — EXCISION, PILONIDAL CYST, EXTENSIVE
Anesthesia: General | Wound class: Clean Contaminated

## 2018-01-30 MED ORDER — HYDROCODONE-ACETAMINOPHEN 5-325 MG PO TABS
ORAL_TABLET | ORAL | Status: DC
Start: 2018-01-30 — End: 2018-01-30
  Filled 2018-01-30: qty 1

## 2018-01-30 MED ORDER — LIDOCAINE HCL (CARDIAC) 20 MG/ML IV SOLN
INTRAVENOUS | Status: DC | PRN
  Administered 2018-01-30: 80 mg via INTRAVENOUS

## 2018-01-30 MED ORDER — SODIUM CHLORIDE 0.9 % IJ SOLN
INTRAMUSCULAR | Status: AC
  Filled 2018-01-30: qty 10

## 2018-01-30 MED ORDER — SUGAMMADEX SODIUM 500 MG/5ML IV SOLN
INTRAVENOUS | Status: DC | PRN
  Administered 2018-01-30: 140 mg via INTRAVENOUS

## 2018-01-30 MED ORDER — SUCCINYLCHOLINE CHLORIDE 20 MG/ML IJ SOLN
INTRAMUSCULAR | Status: AC
  Filled 2018-01-30: qty 1

## 2018-01-30 MED ORDER — FAMOTIDINE 20 MG PO TABS
ORAL_TABLET | ORAL | Status: AC
  Administered 2018-01-30: 20 mg via ORAL
  Filled 2018-01-30: qty 1

## 2018-01-30 MED ORDER — PROPOFOL 10 MG/ML IV BOLUS
INTRAVENOUS | Status: DC | PRN
  Administered 2018-01-30: 150 mg via INTRAVENOUS

## 2018-01-30 MED ORDER — FAMOTIDINE 20 MG PO TABS
20.0000 mg | ORAL_TABLET | Freq: Once | ORAL | Status: AC
  Administered 2018-01-30: 20 mg via ORAL

## 2018-01-30 MED ORDER — EPHEDRINE SULFATE 50 MG/ML IJ SOLN
INTRAMUSCULAR | Status: DC | PRN
  Administered 2018-01-30: 10 mg via INTRAVENOUS

## 2018-01-30 MED ORDER — ONDANSETRON HCL 4 MG/2ML IJ SOLN
INTRAMUSCULAR | Status: DC | PRN
  Administered 2018-01-30: 4 mg via INTRAVENOUS

## 2018-01-30 MED ORDER — KETOROLAC TROMETHAMINE 30 MG/ML IJ SOLN
INTRAMUSCULAR | Status: AC
  Filled 2018-01-30: qty 1

## 2018-01-30 MED ORDER — LACTATED RINGERS IV SOLN
INTRAVENOUS | Status: DC
  Administered 2018-01-30: 06:00:00 via INTRAVENOUS

## 2018-01-30 MED ORDER — BUPIVACAINE LIPOSOME 1.3 % IJ SUSP
INTRAMUSCULAR | Status: AC
  Filled 2018-01-30: qty 20

## 2018-01-30 MED ORDER — BUPIVACAINE-EPINEPHRINE (PF) 0.5% -1:200000 IJ SOLN
INTRAMUSCULAR | Status: AC
  Filled 2018-01-30: qty 30

## 2018-01-30 MED ORDER — PHENYLEPHRINE HCL 10 MG/ML IJ SOLN
INTRAMUSCULAR | Status: AC
  Filled 2018-01-30: qty 1

## 2018-01-30 MED ORDER — FENTANYL CITRATE (PF) 100 MCG/2ML IJ SOLN
INTRAMUSCULAR | Status: AC
  Filled 2018-01-30: qty 2

## 2018-01-30 MED ORDER — HYDROCODONE-ACETAMINOPHEN 5-325 MG PO TABS: 1.0000 | ORAL_TABLET | ORAL | 0 refills | Status: DC | PRN

## 2018-01-30 MED ORDER — MIDAZOLAM HCL 2 MG/2ML IJ SOLN
INTRAMUSCULAR | Status: DC | PRN
  Administered 2018-01-30: 2 mg via INTRAVENOUS

## 2018-01-30 MED ORDER — KETOROLAC TROMETHAMINE 30 MG/ML IJ SOLN
INTRAMUSCULAR | Status: DC | PRN
  Administered 2018-01-30: 30 mg via INTRAVENOUS

## 2018-01-30 MED ORDER — FENTANYL CITRATE (PF) 100 MCG/2ML IJ SOLN
INTRAMUSCULAR | Status: DC | PRN
  Administered 2018-01-30: 50 ug via INTRAVENOUS
  Administered 2018-01-30: 100 ug via INTRAVENOUS
  Administered 2018-01-30: 50 ug via INTRAVENOUS

## 2018-01-30 MED ORDER — ACETAMINOPHEN 10 MG/ML IV SOLN
INTRAVENOUS | Status: DC | PRN
  Administered 2018-01-30: 1000 mg via INTRAVENOUS

## 2018-01-30 MED ORDER — MIDAZOLAM HCL 2 MG/2ML IJ SOLN
INTRAMUSCULAR | Status: AC
  Filled 2018-01-30: qty 2

## 2018-01-30 MED ORDER — HYDROCODONE-ACETAMINOPHEN 5-325 MG PO TABS: 1.0000 | ORAL_TABLET | ORAL | Status: DC | PRN

## 2018-01-30 MED ORDER — SODIUM CHLORIDE 0.9 % IJ SOLN
INTRAMUSCULAR | Status: AC
  Filled 2018-01-30: qty 50

## 2018-01-30 MED ORDER — ACETAMINOPHEN 10 MG/ML IV SOLN
INTRAVENOUS | Status: AC
  Filled 2018-01-30: qty 100

## 2018-01-30 MED ORDER — DEXAMETHASONE SODIUM PHOSPHATE 10 MG/ML IJ SOLN
INTRAMUSCULAR | Status: AC
  Filled 2018-01-30: qty 1

## 2018-01-30 MED ORDER — PROMETHAZINE HCL 25 MG/ML IJ SOLN: 6.2500 mg | INTRAMUSCULAR | Status: DC | PRN

## 2018-01-30 MED ORDER — ONDANSETRON HCL 4 MG/2ML IJ SOLN
INTRAMUSCULAR | Status: AC
  Filled 2018-01-30: qty 2

## 2018-01-30 MED ORDER — EPHEDRINE SULFATE 50 MG/ML IJ SOLN
INTRAMUSCULAR | Status: AC
  Filled 2018-01-30: qty 1

## 2018-01-30 MED ORDER — LIDOCAINE HCL (PF) 2 % IJ SOLN
INTRAMUSCULAR | Status: AC
  Filled 2018-01-30: qty 10

## 2018-01-30 MED ORDER — FENTANYL CITRATE (PF) 100 MCG/2ML IJ SOLN: 25.0000 ug | INTRAMUSCULAR | Status: DC | PRN

## 2018-01-30 MED ORDER — ROCURONIUM BROMIDE 100 MG/10ML IV SOLN
INTRAVENOUS | Status: DC | PRN
  Administered 2018-01-30: 50 mg via INTRAVENOUS

## 2018-01-30 MED ORDER — BUPIVACAINE-EPINEPHRINE (PF) 0.25% -1:200000 IJ SOLN
INTRAMUSCULAR | Status: AC
  Filled 2018-01-30: qty 30

## 2018-01-30 MED ORDER — DEXAMETHASONE SODIUM PHOSPHATE 10 MG/ML IJ SOLN
INTRAMUSCULAR | Status: DC | PRN
  Administered 2018-01-30: 10 mg via INTRAVENOUS

## 2018-01-30 MED ORDER — ROCURONIUM BROMIDE 50 MG/5ML IV SOLN
INTRAVENOUS | Status: AC
  Filled 2018-01-30: qty 2

## 2018-01-30 MED ORDER — PROPOFOL 10 MG/ML IV BOLUS
INTRAVENOUS | Status: AC
Start: 2018-01-30 — End: 2018-01-30
  Filled 2018-01-30: qty 20

## 2018-01-30 MED ORDER — GLYCOPYRROLATE 0.2 MG/ML IJ SOLN
INTRAMUSCULAR | Status: DC | PRN
  Administered 2018-01-30: 0.2 mg via INTRAVENOUS

## 2018-01-30 MED ORDER — LACTATED RINGERS IV SOLN
INTRAVENOUS | Status: DC
  Administered 2018-01-30: 08:00:00 via INTRAVENOUS

## 2018-01-30 MED ORDER — HYDROCODONE-ACETAMINOPHEN 5-325 MG PO TABS
ORAL_TABLET | ORAL | Status: AC
  Filled 2018-01-30: qty 1

## 2018-01-30 SURGICAL SUPPLY — 61 items
ANCHOR TIS RET SYS 235ML (MISCELLANEOUS) IMPLANT
BLADE SURG 15 STRL SS SAFETY (BLADE) ×3 IMPLANT
BLADE SURG SZ11 CARB STEEL (BLADE) ×3 IMPLANT
BRIEF STRETCH MATERNITY 2XLG (MISCELLANEOUS) ×3 IMPLANT
CANISTER SUCT 1200ML W/VALVE (MISCELLANEOUS) ×6 IMPLANT
CATH ROBINSON RED A/P 16FR (CATHETERS) ×3 IMPLANT
CHLORAPREP W/TINT 26ML (MISCELLANEOUS) ×3 IMPLANT
DERMABOND ADVANCED (GAUZE/BANDAGES/DRESSINGS) ×2
DERMABOND ADVANCED .7 DNX12 (GAUZE/BANDAGES/DRESSINGS) ×1 IMPLANT
DRAPE LAPAROTOMY 100X77 ABD (DRAPES) ×3 IMPLANT
DRAPE LEGGINS SURG 28X43 STRL (DRAPES) ×3 IMPLANT
DRAPE UNDER BUTTOCK W/FLU (DRAPES) ×3 IMPLANT
DRSG GAUZE PETRO 6X36 STRIP ST (GAUZE/BANDAGES/DRESSINGS) ×3 IMPLANT
DRSG TEGADERM 2-3/8X2-3/4 SM (GAUZE/BANDAGES/DRESSINGS) ×9 IMPLANT
DRSG TEGADERM 4X4.75 (GAUZE/BANDAGES/DRESSINGS) ×9 IMPLANT
DRSG TELFA 3X8 NADH (GAUZE/BANDAGES/DRESSINGS) ×3 IMPLANT
DRSG TELFA 4X3 1S NADH ST (GAUZE/BANDAGES/DRESSINGS) ×9 IMPLANT
ELECT REM PT RETURN 9FT ADLT (ELECTROSURGICAL) ×3
ELECTRODE REM PT RTRN 9FT ADLT (ELECTROSURGICAL) ×1 IMPLANT
GLOVE BIO SURGEON STRL SZ7.5 (GLOVE) ×3 IMPLANT
GLOVE BIO SURGEON STRL SZ8 (GLOVE) ×3 IMPLANT
GLOVE INDICATOR 8.0 STRL GRN (GLOVE) ×6 IMPLANT
GOWN STRL REUS W/ TWL LRG LVL3 (GOWN DISPOSABLE) ×3 IMPLANT
GOWN STRL REUS W/ TWL XL LVL3 (GOWN DISPOSABLE) ×1 IMPLANT
GOWN STRL REUS W/TWL LRG LVL3 (GOWN DISPOSABLE) ×6
GOWN STRL REUS W/TWL XL LVL3 (GOWN DISPOSABLE) ×2
IRRIGATION STRYKERFLOW (MISCELLANEOUS) IMPLANT
IRRIGATOR STRYKERFLOW (MISCELLANEOUS)
IV LACTATED RINGERS 1000ML (IV SOLUTION) ×3 IMPLANT
IV NS 1000ML (IV SOLUTION) ×2
IV NS 1000ML BAXH (IV SOLUTION) ×1 IMPLANT
KIT PINK PAD W/HEAD ARE REST (MISCELLANEOUS) ×3
KIT PINK PAD W/HEAD ARM REST (MISCELLANEOUS) ×1 IMPLANT
KIT TURNOVER CYSTO (KITS) ×3 IMPLANT
KIT TURNOVER KIT A (KITS) ×3 IMPLANT
LABEL OR SOLS (LABEL) ×3 IMPLANT
NEEDLE HYPO 22GX1.5 SAFETY (NEEDLE) ×3 IMPLANT
NEEDLE HYPO 25X1 1.5 SAFETY (NEEDLE) ×3 IMPLANT
NS IRRIG 500ML POUR BTL (IV SOLUTION) ×6 IMPLANT
PACK BASIN MINOR ARMC (MISCELLANEOUS) ×3 IMPLANT
PACK GYN LAPAROSCOPIC (MISCELLANEOUS) ×3 IMPLANT
PAD OB MATERNITY 4.3X12.25 (PERSONAL CARE ITEMS) ×6 IMPLANT
PAD PREP 24X41 OB/GYN DISP (PERSONAL CARE ITEMS) ×6 IMPLANT
SCALPEL HARMONIC ACE (MISCELLANEOUS) IMPLANT
SCISSORS METZENBAUM CVD 33 (INSTRUMENTS) IMPLANT
SLEEVE ENDOPATH XCEL 5M (ENDOMECHANICALS) ×3 IMPLANT
SOL PREP PVP 2OZ (MISCELLANEOUS) ×3
SOLUTION PREP PVP 2OZ (MISCELLANEOUS) ×1 IMPLANT
SURGILUBE 2OZ TUBE FLIPTOP (MISCELLANEOUS) ×3 IMPLANT
SUT MNCRL 4-0 (SUTURE) ×2
SUT MNCRL 4-0 27XMFL (SUTURE) ×1
SUT SILK 0 CT 1 30 (SUTURE) ×3 IMPLANT
SUT VIC AB 0 UR5 27 (SUTURE) ×3 IMPLANT
SUT VIC AB 3-0 SH 27 (SUTURE) ×2
SUT VIC AB 3-0 SH 27X BRD (SUTURE) ×1 IMPLANT
SUTURE MNCRL 4-0 27XMF (SUTURE) ×1 IMPLANT
SYR 30ML LL (SYRINGE) ×3 IMPLANT
SYR CONTROL 10ML (SYRINGE) ×3 IMPLANT
TROCAR ENDO BLADELESS 11MM (ENDOMECHANICALS) IMPLANT
TROCAR XCEL NON-BLD 5MMX100MML (ENDOMECHANICALS) ×3 IMPLANT
TUBING INSUFFLATION (TUBING) ×3 IMPLANT

## 2018-01-30 NOTE — Op Note (Signed)
Preoperative diagnosis: Anal polyp.  Postoperative diagnosis: Same.  Operative procedure: Excision of anal polyp.  Operating Surgeon: Hervey Ard, MD.  Anesthesia: General endotracheal, Marcaine 0.5% with 1-200,000 units of epinephrine, 30 cc.  Clinical note: This 47 year old woman is developed a recurrent anal polyp, previously having undergone excision of a similar lesion in October 2018 when living in [place], [place].  She presents today for excision.  Operative note: The patient underwent general endotracheal anesthesia as she is going to have a diagnostic laparoscopy completed by Dr. Quenten Raven after the anal polyp was removed.  She was placed in dorsal lithotomy position.  SCD stockings for DVT prevention.  The perineum was prepped with Betadine solution and draped.  Digital exam showed the polyp at the 5-6 o'clock position.  This was prolapsed through the anus and a anal speculum placed.  The polyp was moderately broad-based.  This was controlled with a hemostat and the polyp amputated.  A 3-0 chromic running locking suture was used for hemostasis.  This was reinforced with 1 additional 3-0 chromic figure-of-eight suture for hemostasis.  A Vaseline gauze pack was placed for removal at the end of the procedure.  The patient was repositioned for a diagnostic laparoscopy under Dr. DeFrancisco's care which will be dictated separately.

## 2018-01-30 NOTE — Interval H&P Note (Signed)
History and Physical Interval Note:  01/30/2018 7:30 AM  Evelyn Holloway  has presented today for surgery, with the diagnosis of ANAL POLYP  The various methods of treatment have been discussed with the patient and family. After consideration of risks, benefits and other options for treatment, the patient has consented to  Procedure(s): EXCISION ANAL POLYP (N/A) LAPAROSCOPY DIAGNOSTIC WITH BIOPSIES (N/A) as a surgical intervention .  The patient's history has been reviewed, patient examined, no change in status, stable for surgery.  I have reviewed the patient's chart and labs.  Questions were answered to the patient's satisfaction.     Hassell Done A [name]

## 2018-01-30 NOTE — Anesthesia Procedure Notes (Signed)
Performed by: Carron Curie, CRNA

## 2018-01-30 NOTE — Anesthesia Preprocedure Evaluation (Signed)
Anesthesia Evaluation  Patient identified by MRN, date of birth, ID band Patient awake    Reviewed: Allergy & Precautions, H&P , NPO status , Patient's Chart, lab work & pertinent test results, reviewed documented beta blocker date and time   Airway Mallampati: III  TM Distance: >3 FB Neck ROM: full    Dental  (+) Caps, Dental Advidsory Given   Pulmonary neg pulmonary ROS, former smoker,           Cardiovascular Exercise Tolerance: Good negative cardio ROS       Neuro/Psych PSYCHIATRIC DISORDERS Anxiety negative neurological ROS     GI/Hepatic hiatal hernia, GERD  ,(+) Hepatitis -, A  Endo/Other  neg diabetesHypothyroidism   Renal/GU negative Renal ROS  negative genitourinary   Musculoskeletal   Abdominal   Peds  Hematology negative hematology ROS (+)   Anesthesia Other Findings Past Medical History: No date: Anxiety No date: Colon polyp No date: GERD (gastroesophageal reflux disease)     Comment:   TUMS PRN No date: Heavy periods 1991: Hepatitis     Comment:  HEP A No date: History of hiatal hernia No date: Hyperlipidemia No date: Hypothyroidism No date: Irregular menses No date: Pelvic pain in female No date: Thyroid disease No date: Vaginal Pap smear, abnormal No date: Vitamin B12 deficiency anemia due to intrinsic factor  deficiency   Reproductive/Obstetrics negative OB ROS                             Anesthesia Physical Anesthesia Plan  ASA: II  Anesthesia Plan: General   Post-op Pain Management:    Induction: Intravenous  PONV Risk Score and Plan: Ondansetron and Dexamethasone  Airway Management Planned: Oral ETT  Additional Equipment:   Intra-op Plan:   Post-operative Plan: Extubation in OR  Informed Consent: I have reviewed the patients History and Physical, chart, labs and discussed the procedure including the risks, benefits and alternatives for the  proposed anesthesia with the patient or authorized representative who has indicated his/her understanding and acceptance.   Dental Advisory Given  Plan Discussed with: Anesthesiologist, CRNA and Surgeon  Anesthesia Plan Comments:         Anesthesia Quick Evaluation

## 2018-01-30 NOTE — Anesthesia Procedure Notes (Signed)
Procedure Name: Intubation Date/Time: 01/30/2018 7:42 AM Performed by: Carron Curie, CRNA Pre-anesthesia Checklist: Patient identified, Patient being monitored, Timeout performed, Emergency Drugs available and Suction available Patient Re-evaluated:Patient Re-evaluated prior to induction Oxygen Delivery Method: Circle system utilized Preoxygenation: Pre-oxygenation with 100% oxygen Induction Type: IV induction Ventilation: Mask ventilation without difficulty Laryngoscope Size: Mac and 3 Grade View: Grade II Tube type: Oral Tube size: 7.0 mm Number of attempts: 1 Airway Equipment and Method: Stylet Placement Confirmation: ETT inserted through vocal cords under direct vision,  positive ETCO2 and breath sounds checked- equal and bilateral Secured at: 21 cm Tube secured with: Tape Dental Injury: Teeth and Oropharynx as per pre-operative assessment

## 2018-01-30 NOTE — Op Note (Signed)
OPERATIVE NOTE:  Evelyn Holloway PROCEDURE DATE: 01/30/2018   PREOPERATIVE DIAGNOSIS: 1.  Severe dysmenorrhea 2.  Dyspareunia 3.  Fibroids 4.  Abnormal ultrasound suspicious for adenomyosis  POSTOPERATIVE DIAGNOSIS: Same as above and endometriosis PROCEDURE: Laparoscopy with biopsies and excision and fulguration of endometriosis SURGEON:  Brayton Mars, MD ASSISTANTS: Aida Raider ANESTHESIA: General INDICATIONS: 47 y.o. [id number] with long history of menorrhagia, prior workup in [place] showing findings possibly consistent with endometriosis,.  Patient also previously undergone anal polyp removal by Dr. Jeannette Corpus.  FINDINGS: Uterus top normal size with several small fibroids; bilateral ovaries are normal the right is evidence of cyst; fallopian tubes are normal; prominent uterosacral ligaments: Bladder flap peritoneum demonstrates a 39mm implant consistent with endometriosis; (excised).  No other obvious defects.  Appendix looks normal.  Upper abdomen appears normal. Bimanual exam demonstrates good uterine descensus contractions the pelvis which will enable hysterectomy to be performed by LAVH.   I/O's: Total I/O In: -  Out: 15 [Urine:15] COUNTS:  YES SPECIMENS: Peritoneal biopsies (cul-de-sac, left ovarian fossa, right ligament, bladder ANTIBIOTIC PROPHYLAXIS:N/A COMPLICATIONS: None immediate  PROCEDURE IN DETAIL: Patient was brought room and placed in supine position.  General anesthesia is difficult.  Procedure prior to the laparoscopy was completed by Dr. Tollie Pizza and a polyp was removed.  Following completion of this procedure placed in position as stirrups.  A ChloraPrep, perineal and intravaginal prep and drape was performed in standard fashion. Timeout was completed. Bladder was drained of 15 cc of urine with a red Robinson catheter.  Hulka tenaculum was placed on the cervix to facilitate manipulation of the uterus intraoperatively. Laparoscopy was performed in  standard fashion.  Subumbilical vertical incision 5 mm in length was made.  The Optiview laparoscopic trocar system was used to place a 5 mm camera directly into the abdominal pelvic cavity without evidence of bowel or vascular injury.  A second 5 mm port was placed in the suprapubic region several fingerbreadths above the symphysis pubis in the midline.  Inspection of the pelvis and abdomen revealed the above-noted findings.  Biopsy forceps were then used to take representative biopsies of the peritoneum in the cul-de-sac, left ovarian fossa, and right uterosacral ligament.  These areas were cauterized with Kleppinger bipolar forceps.  An additional resection of peritoneal endometriosis on the bladder flap was performed.  All specimens were sent to pathology for evaluation.  The bladder flap lesion was not cauterized because of the location of the implant.  Pelvis was irrigated with saline solution and this fluid was aspirated.  Once satisfied with hemostasis and complete inspection of the pelvis, the procedure was terminated with all instrumentation being removed from the abdominal pelvic cavity.  Pneumoperitoneum was released.  The incisions were closed with 4-0 Monocryl suture in a simple interrupted manner.  Dermabond glue was placed over the incisions.  Patient was then awakened mobilized and taken recovery in satisfactory condition.  The patient is an excellent candidate for LAVH if definitive treatment for endometriosis is desired.  Divya Munshi A. Zipporah Plants, MD, ACOG [facility]

## 2018-01-30 NOTE — Anesthesia Postprocedure Evaluation (Signed)
Anesthesia Post Note  Patient: Evelyn Holloway  Procedure(s) Performed: EXCISION ANAL POLYP (N/A ) LAPAROSCOPY DIAGNOSTIC WITH BIOPSIES (N/A )  Patient location during evaluation: PACU Anesthesia Type: General Level of consciousness: awake and alert Pain management: pain level controlled Vital Signs Assessment: post-procedure vital signs reviewed and stable Respiratory status: spontaneous breathing, nonlabored ventilation, respiratory function stable and patient connected to nasal cannula oxygen Cardiovascular status: blood pressure returned to baseline and stable Postop Assessment: no apparent nausea or vomiting Anesthetic complications: no     Last Vitals:  Vitals:   01/30/18 0957 01/30/18 1050  BP: (!) 110/58 111/66  Pulse: 66 66  Resp: 16 16  Temp: (!) 36.4 C   SpO2: 100% 100%    Last Pain:  Vitals:   01/30/18 1050  TempSrc:   PainSc: 2                  Martha Clan

## 2018-01-30 NOTE — Transfer of Care (Signed)
Immediate Anesthesia Transfer of Care Note  Patient: Evelyn Holloway  Procedure(s) Performed: EXCISION ANAL POLYP (N/A ) LAPAROSCOPY DIAGNOSTIC WITH BIOPSIES (N/A )  Patient Location: PACU  Anesthesia Type:General  Level of Consciousness: drowsy  Airway & Oxygen Therapy: Patient Spontanous Breathing  Post-op Assessment: Report given to RN  Post vital signs: stable  Last Vitals:  Vitals Value Taken Time  BP 106/75 01/30/2018  9:14 AM  Temp    Pulse 89 01/30/2018  9:14 AM  Resp 19 01/30/2018  9:14 AM  SpO2 97 % 01/30/2018  9:14 AM  Vitals shown include unvalidated device data.  Last Pain:  Vitals:   01/30/18 0559  TempSrc: Temporal  PainSc: 0-No pain         Complications: No apparent anesthesia complications

## 2018-01-30 NOTE — Discharge Instructions (Signed)
° ° ° ° °  Laparoscopic Lysis of Abdominal Adhesions, Care After Refer to this sheet in the next few weeks. These instructions provide you with information about caring for yourself after your procedure. Your health care provider may also give you more specific instructions. Your treatment has been planned according to current medical practices, but problems sometimes occur. Call your health care provider if you have any problems or questions after your procedure. What can I expect after the procedure? After your procedure, it is common to have some pain around the incision. Follow these instructions at home:  Take medicines only as directed by your health care provider.  You may need to start by eating only a little at a time. Start with liquids. As your appetite improves, gradually return to eating solid foods.  Do not lift anything that is heavier than 10 lb (4.5 kg) for four weeks.  Return to your normal activities as directed by your health care provider. Ask your health care provider what activities are safe for you.  There are many different ways to close and cover an incision, including stitches (sutures), skin glue, and adhesive strips. Follow instructions from your health care provider about: ? Incision care. ? Bandage (dressing) changes and removal. ? Incision closure removal.  Check your incisions every day for signs of infection. Watch for: ? Redness, swelling, or pain. ? Fluid , blood, or pus. Contact a health care provider if:  You develop a fever or chills.  You have redness, swelling, or pain at the site of your incisions.  You have fluid, blood, or pus coming from your incisions.  There is a bad smell coming from your incisions or the dressing.  Your pain gets worse.  You have a cough.  You have nausea and vomiting that does not go away after 3 hours. Get help right away if:  You develop severe pain in your abdomen or your chest.  You develop shortness of  breath.  You develop nausea or vomiting that is severe or keeps coming back. This information is not intended to replace advice given to you by your health care provider. Make sure you discuss any questions you have with your health care provider. Document Released: 02/21/2015 Document Revised: 03/14/2016 Document Reviewed: 10/03/2014 Elsevier Interactive Patient Education  2018 [place]   1) The drugs that you were given will stay in your system until tomorrow so for the next 24 hours you should not:  A) Drive an automobile B) Make any legal decisions C) Drink any alcoholic beverage   2) You may resume regular meals tomorrow.  Today it is better to start with liquids and gradually work up to solid foods.  You may eat anything you prefer, but it is better to start with liquids, then soup and crackers, and gradually work up to solid foods.   3) Please notify your doctor immediately if you have any unusual bleeding, trouble breathing, redness and pain at the surgery site, drainage, fever, or pain not relieved by medication.    4) Additional Instructions:        Please contact your physician with any problems or Same Day Surgery at [phone], Monday through Friday 6 am to 4 pm, or [place] at [facility] number at [phone].

## 2018-01-30 NOTE — H&P (Signed)
No change in clinical history or exam.    For excision of anal papilloma and diag lap (by Dr Bea Laura).

## 2018-01-30 NOTE — Anesthesia Post-op Follow-up Note (Signed)
Anesthesia QCDR form completed.        

## 2018-01-31 LAB — RPR: RPR: NONREACTIVE

## 2018-02-02 ENCOUNTER — Encounter: Payer: Self-pay | Admitting: General Surgery

## 2018-02-02 LAB — SURGICAL PATHOLOGY

## 2018-02-03 ENCOUNTER — Telehealth: Payer: Self-pay

## 2018-02-03 MED ORDER — SEVOFLURANE IN SOLN
RESPIRATORY_TRACT | Status: AC
  Filled 2018-02-03: qty 250

## 2018-02-03 NOTE — Telephone Encounter (Signed)
Notified patient as instructed, patient pleased. Discussed follow-up appointments, patient agrees  

## 2018-02-03 NOTE — Telephone Encounter (Signed)
-----   Message from Robert Bellow, MD sent at 02/03/2018  8:28 AM EDT ----- Please notify path OK from anal polyp. Same as polyp removed in [place]. . Dr. Esmeralda Links will review his biopsies results with her.

## 2018-02-09 ENCOUNTER — Telehealth: Payer: Self-pay | Admitting: Obstetrics and Gynecology

## 2018-02-09 NOTE — Telephone Encounter (Signed)
The patient called and stated that her mother died last night, She will be traveling to [place] and will be seen when she returns to the [place] The patient is reschuled for 02/19/18. Thank you.

## 2018-02-10 ENCOUNTER — Encounter: Payer: PRIVATE HEALTH INSURANCE | Admitting: Obstetrics and Gynecology

## 2018-02-12 ENCOUNTER — Ambulatory Visit: Payer: BLUE CROSS/BLUE SHIELD | Admitting: General Surgery

## 2018-02-19 ENCOUNTER — Ambulatory Visit (INDEPENDENT_AMBULATORY_CARE_PROVIDER_SITE_OTHER): Payer: PRIVATE HEALTH INSURANCE | Admitting: Obstetrics and Gynecology

## 2018-02-19 ENCOUNTER — Encounter: Payer: Self-pay | Admitting: Obstetrics and Gynecology

## 2018-02-19 VITALS — BP 121/86 | HR 80 | Ht 62.0 in | Wt 153.4 lb

## 2018-02-19 DIAGNOSIS — Z09 Encounter for follow-up examination after completed treatment for conditions other than malignant neoplasm: Secondary | ICD-10-CM

## 2018-02-19 DIAGNOSIS — Z9889 Other specified postprocedural states: Secondary | ICD-10-CM

## 2018-02-19 DIAGNOSIS — N809 Endometriosis, unspecified: Secondary | ICD-10-CM

## 2018-02-19 MED ORDER — MEDROXYPROGESTERONE ACETATE 10 MG PO TABS: 30.0000 mg | ORAL_TABLET | Freq: Every day | ORAL | 1 refills | Status: DC

## 2018-02-19 NOTE — Addendum Note (Signed)
Addended by: Elouise Munroe on: 02/19/2018 11:34 AM   Modules accepted: Orders

## 2018-02-19 NOTE — Patient Instructions (Signed)
1.  Begin taking Provera 30 mg daily until surgery 2.  Surgical dates for LAVH BSO are given today 3.  Preop appointment will be made the week prior to surgery.

## 2018-02-19 NOTE — Progress Notes (Signed)
Chief complaint: 1.  Postop check 2.  As post laparoscopy with excision and fulguration of endometriosis  Evelyn Holloway presents today for her postop check.  She is several weeks status post surgery.  She had been away, to [place], for funeral of her mom who passed suddenly from massive stroke and heart attack.  She is grieving appropriately. Patient reports no significant discomfort following surgery.  PATHOLOGY: DIAGNOSIS:  A. ANAL POLYP; EXCISION:  - BENIGN FIBROEPITHELIAL POLYP.  - NEGATIVE FOR DYSPLASIA AND MALIGNANCY.   B. CUL-DE-SAC; BIOPSY:  - MESOTHELIAL LINED FIBROADIPOSE TISSUE WITH FOCAL VASCULAR CONGESTION.  - NEGATIVE FOR ENDOMETRIOSIS AND MALIGNANCY.   C. OVARIAN FOSSA, LEFT; BIOPSY:  - MESOTHELIAL LINED FIBROADIPOSE TISSUE WITH FOCAL VASCULAR CONGESTION.  - NEGATIVE FOR ENDOMETRIOSIS AND MALIGNANCY.   D. UTERINE SACRAL, RIGHT; BIOPSY:  - MESOTHELIAL LINED FIBROUS TISSUE.  - NEGATIVE FOR ENDOMETRIOSIS AND MALIGNANCY.   E. BLADDER FLAP; BIOPSY:  - MESOTHELIAL LINED FIBROADIPOSE TISSUE.  - NEGATIVE FOR ENDOMETRIOSIS AND MALIGNANCY.   Findings from laparoscopy were reviewed.  Patient does understand that she has a visual diagnosis of endometriosis noted on the bladder and likely adenomyosis of the uterus. She is wanting to proceed with definitive surgery in the near future.  She would like to be on medication to avoid having any further menses.  Past medical history, past surgical history, problem list, medications, and allergies are reviewed  OBJECTIVE: BP 121/86   Pulse 80   Ht 5\' 2"  (1.575 m)   Wt 153 lb 6.4 oz (69.6 kg)   LMP 02/02/2018 (Exact Date)   BMI 28.06 kg/m  Well-appearing female no acute distress.  Alert and oriented. Abdomen: Soft, nontender; laparoscopy sites are well approximated; surgical suture is still remains.  No hernia or infection noted.  ASSESSMENT: 1.  Status post laparoscopic excision and fulguration of endometriosis 2.  Patient desires  definitive surgery 3.  Patient has had [facility] testing for high risk conditions, results pending  PLAN: 1.  Begin Provera 30 mg daily for suppression of menses.  This will be continued until surgery 2.  Schedule LAVH BSO in the near future 3.  Return for preop appointment prior to surgery. 4.  Will review [facility] detail results when available  Brayton Mars, MD  Note: This dictation was prepared with Dragon dictation along with smaller phrase technology. Any transcriptional errors that result from this process are unintentional.

## 2018-03-05 ENCOUNTER — Ambulatory Visit (INDEPENDENT_AMBULATORY_CARE_PROVIDER_SITE_OTHER): Payer: BLUE CROSS/BLUE SHIELD | Admitting: General Surgery

## 2018-03-05 ENCOUNTER — Encounter: Payer: Self-pay | Admitting: General Surgery

## 2018-03-05 VITALS — BP 120/70 | HR 70 | Resp 14 | Ht 62.0 in | Wt 153.0 lb

## 2018-03-05 DIAGNOSIS — K620 Anal polyp: Secondary | ICD-10-CM

## 2018-03-05 NOTE — Progress Notes (Signed)
Patient ID: Evelyn Holloway, female   DOB: 1971-01-25, 47 y.o.   MRN: [id number]  Chief Complaint  Patient presents with  . Routine Post Op    HPI Evelyn Holloway is a 47 y.o. female here for follow up from anal polyp removal done on 01/30/18. She reports the first 2 weeks was very painful. She says the pain is completely gone.  HPI  Past Medical History:  Diagnosis Date  . Anxiety   . Colon polyp   . GERD (gastroesophageal reflux disease)     TUMS PRN  . Heavy periods   . 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 History of hiatal hernia   . Hyperlipidemia   . Hypothyroidism   . Irregular menses   . Pelvic pain in female   . Thyroid disease   . Vaginal Pap smear, abnormal   . Vitamin B12 deficiency anemia due to intrinsic factor deficiency     Past Surgical History:  Procedure Laterality Date  . BACK SURGERY  11/2017   Eduardo Osier  . CHOLECYSTECTOMY    . COLONOSCOPY  07/2017   in [place]/ colon polyps  . COLONOSCOPY  01/08/2018   Dr Vira Agar  . COLPOSCOPY    . GALLBLADDER SURGERY  2010  . HEMORRHOID SURGERY  07/2017   in [place]  . LAPAROSCOPY N/A 01/30/2018   Procedure: LAPAROSCOPY DIAGNOSTIC WITH BIOPSIES;  Surgeon: Brayton Mars, MD;  Location: [facility] ORS;  Service: Gynecology;  Laterality: N/A;  . PILONIDAL CYST EXCISION N/A 01/30/2018   Procedure: EXCISION ANAL POLYP;  Surgeon: Robert Bellow, MD;  Location: [facility] ORS;  Service: General;  Laterality: N/A;    Family History  Problem Relation Age of Onset  . Cancer Maternal Grandfather        colon  . Hypertension Mother   . Thyroid disease Maternal Grandmother   . Diabetes Neg Hx   . Heart disease Neg Hx     Social History Social History   Tobacco Use  . Smoking status: Former Smoker    Packs/day: 0.50    Years: 23.00    Pack years: 11.50    Types: Cigarettes    Last attempt to quit: 01/27/2016    Years since quitting: 2.1  . Smokeless tobacco: Never Used  Substance Use Topics  .  Alcohol use: Yes    Comment: occas  . Drug use: No    No Known Allergies  Current Outpatient Medications  Medication Sig Dispense Refill  . cyclobenzaprine (FLEXERIL) 10 MG tablet Take 10 mg by mouth as needed.     . Diclofenac Sodium 3 % GEL Place 1 application onto the skin every 12 (twelve) hours as needed (pain). 50 g 0  . FLUoxetine (PROZAC) 10 MG tablet Take 10 mg by mouth every evening.     Marland Kitchen levothyroxine (SYNTHROID, LEVOTHROID) 25 MCG tablet TAKE 1 TABLET (25 MCG TOTAL) BY MOUTH DAILY BEFORE BREAKFAST. 30 tablet 3  . medroxyPROGESTERone (PROVERA) 10 MG tablet Take 3 tablets (30 mg total) by mouth daily. 90 tablet 1  . Multiple Vitamins-Minerals (MULTIVITAMIN WITH MINERALS) tablet Take 1 tablet by mouth daily.    . naproxen (NAPROSYN) 500 MG tablet Take 500 mg by mouth daily as needed for moderate pain.     . Vitamin D, Ergocalciferol, (DRISDOL) 50000 units CAPS capsule TAKE 1 CAPSULE BY MOUTH ONCE A WEEK. (Patient taking differently: TAKE 1 CAPSULE BY MOUTH ONCE A WEEK. Monday) 30 capsule 3   No current facility-administered medications  for this visit.     Review of Systems Review of Systems  Constitutional: Negative.   Respiratory: Negative.   Cardiovascular: Negative.   Gastrointestinal: Negative.     Blood pressure 120/70, pulse 70, resp. rate 14, height 5\' 2"  (1.575 m), weight 153 lb (69.4 kg), last menstrual period 03/05/2018.  Physical Exam Physical Exam  Constitutional: She is oriented to person, place, and time. She appears well-developed and well-nourished.  Genitourinary: Rectum normal.  Genitourinary Comments: Anal polyp removal site non-palpable.    Neurological: She is alert and oriented to person, place, and time.  Skin: Skin is warm and dry.  Psychiatric: She has a normal mood and affect.    Data Reviewed  DIAGNOSIS:  A. ANAL POLYP; EXCISION:  - BENIGN FIBROEPITHELIAL POLYP.  - NEGATIVE FOR DYSPLASIA AND MALIGNANCY.    Assessment    Doing  well after transanal excision of polyp.      Plan    Follow up as needed     HPI, Physical Exam, Assessment and Plan have been scribed under the direction and in the presence of Robert Bellow, MD  Concepcion Living, LPN  I have completed the exam and reviewed the above documentation for accuracy and completeness.  I agree with the above.  Haematologist has been used and any errors in dictation or transcription are unintentional.  Hervey Ard, M.D., F.A.C.S.   Forest Gleason Linzey Ramser 03/05/2018, 9:25 PM

## 2018-03-05 NOTE — Patient Instructions (Signed)
Follow up as needed

## 2018-03-14 ENCOUNTER — Other Ambulatory Visit: Payer: Self-pay | Admitting: Obstetrics and Gynecology

## 2018-03-27 ENCOUNTER — Encounter: Payer: Self-pay | Admitting: Obstetrics and Gynecology

## 2018-03-27 ENCOUNTER — Ambulatory Visit (INDEPENDENT_AMBULATORY_CARE_PROVIDER_SITE_OTHER): Payer: BLUE CROSS/BLUE SHIELD | Admitting: Obstetrics and Gynecology

## 2018-03-27 VITALS — BP 130/94 | HR 91 | Ht 62.0 in | Wt 153.9 lb

## 2018-03-27 DIAGNOSIS — Z1371 Encounter for nonprocreative screening for genetic disease carrier status: Secondary | ICD-10-CM | POA: Diagnosis not present

## 2018-03-27 NOTE — Progress Notes (Signed)
  Subjective:     Patient ID: Evelyn Holloway, female   DOB: 06/06/1971, 47 y.o.   MRN: [id number]  HPI  Here to discuss genetic test results. See scan results in chart, also wants to discuss results and their relationship to endometriosis findings and need for surgery.  Had 15 day menses since started Depo after exploratory lap surgery with Dr Burney Gauze. Otherwise feeling fine.  Review of Systems Negative except stated above in HPI    Objective:   Physical Exam A&Ox4 Well groomed female in no distress Blood pressure (!) 130/94, pulse 91, height 5\' 2"  (1.575 m), weight 153 lb 14.4 oz (69.8 kg), last menstrual period 03/05/2018.   PE not indicated Assessment:     Genetic lab reveals Variant of undetermined significance Endometriosis H/O Rectal polyps    Plan:     Counseled at length of findings, and copy of results given to patient. Encouraged dropping off copy to Dr Fleet Contras for review in relationship to colonoscopy maintenance. The results do not sow any relationship to endometrial hyperplasia or uterine/ovarian cancers, and therefor surgery indication is not influenced by the results.  Patient desires surgery anyway and will schedule soon as discussed previously with Dr Mendel Ryder.   Harlee Eckroth,CNM

## 2018-04-14 ENCOUNTER — Other Ambulatory Visit: Payer: Self-pay | Admitting: Obstetrics and Gynecology

## 2018-04-23 ENCOUNTER — Other Ambulatory Visit: Payer: Self-pay | Admitting: Obstetrics and Gynecology

## 2018-04-28 ENCOUNTER — Ambulatory Visit (INDEPENDENT_AMBULATORY_CARE_PROVIDER_SITE_OTHER): Payer: BLUE CROSS/BLUE SHIELD | Admitting: Obstetrics and Gynecology

## 2018-04-28 ENCOUNTER — Other Ambulatory Visit: Payer: Self-pay

## 2018-04-28 ENCOUNTER — Encounter
Admission: RE | Admit: 2018-04-28 | Discharge: 2018-04-28 | Disposition: A | Discharge status: Home / Self Care | Payer: BLUE CROSS/BLUE SHIELD | Source: Ambulatory Visit | Attending: Obstetrics and Gynecology | Admitting: Obstetrics and Gynecology

## 2018-04-28 ENCOUNTER — Encounter: Payer: Self-pay | Admitting: Obstetrics and Gynecology

## 2018-04-28 VITALS — BP 132/79 | HR 76 | Ht 62.0 in | Wt 156.4 lb

## 2018-04-28 DIAGNOSIS — N941 Unspecified dyspareunia: Secondary | ICD-10-CM | POA: Diagnosis not present

## 2018-04-28 DIAGNOSIS — Z01818 Encounter for other preprocedural examination: Secondary | ICD-10-CM | POA: Diagnosis present

## 2018-04-28 DIAGNOSIS — N8000 Endometriosis of the uterus, unspecified: Secondary | ICD-10-CM

## 2018-04-28 DIAGNOSIS — N946 Dysmenorrhea, unspecified: Secondary | ICD-10-CM

## 2018-04-28 DIAGNOSIS — N809 Endometriosis, unspecified: Secondary | ICD-10-CM

## 2018-04-28 HISTORY — DX: Post-traumatic stress disorder, unspecified: F43.10

## 2018-04-28 LAB — CBC WITH DIFFERENTIAL/PLATELET
BASOS PCT: 0 %
Basophils Absolute: 0 10*3/uL (ref 0–0.1)
Eosinophils Absolute: 0.2 10*3/uL (ref 0–0.7)
Eosinophils Relative: 2 %
HEMATOCRIT: 45 % (ref 35.0–47.0)
HEMOGLOBIN: 15.5 g/dL (ref 12.0–16.0)
LYMPHS ABS: 1.8 10*3/uL (ref 1.0–3.6)
LYMPHS PCT: 22 %
MCH: 32 pg (ref 26.0–34.0)
MCHC: 34.5 g/dL (ref 32.0–36.0)
MCV: 92.6 fL (ref 80.0–100.0)
MONO ABS: 0.7 10*3/uL (ref 0.2–0.9)
MONOS PCT: 8 %
NEUTROS ABS: 5.5 10*3/uL (ref 1.4–6.5)
NEUTROS PCT: 68 %
Platelets: 232 10*3/uL (ref 150–440)
RBC: 4.86 MIL/uL (ref 3.80–5.20)
RDW: 12.1 % (ref 11.5–14.5)
WBC: 8.2 10*3/uL (ref 3.6–11.0)

## 2018-04-28 LAB — TYPE AND SCREEN
ABO/RH(D): O POS
ANTIBODY SCREEN: NEGATIVE

## 2018-04-28 NOTE — Progress Notes (Signed)
PREOPERATIVE HISTORY AND PHYSICAL  Date of surgery: 05/04/2018 Diagnosis: Symptomatic endometriosis Procedure: [facility] BSO    Patient is a 47 y.o. G3P1090female scheduled for surgery on 05/04/2018. Status post laparoscopy on 01/30/2018 FINDINGS: Uterus top normal size with several small fibroids; bilateral ovaries are normal the right is evidence of cyst; fallopian tubes are normal; prominent uterosacral ligaments: Bladder flap peritoneum demonstrates a 67mm implant consistent with endometriosis; (excised).  No other obvious defects.  Appendix looks normal.  Upper abdomen appears normal. Bimanual exam demonstrates good uterine descensus contractions the pelvis which will enable hysterectomy to be performed by LAVH; peritoneal biopsies did not demonstrate endometriosis; however, symptom complex is consistent with this disease state.  She does desire definitive surgery at this time.  Patient has history of severe dysmenorrhea, chronic pelvic pain, and uterine fibroids.  Work-up in [place] included pelvic ultrasound within the past year that showed uterine fibroids and changes consistent with adenomyosis.  Gynecologic history: Menarche-age 69 Intervals-every 3-4 weeks Duration-3-7 days Flow is heavy with clots; history of anemia as a teenager Long history of severe dysmenorrhea 100 out of a scale of 1-10!;  Patient has missed school and work because of severe pain; she has passed out from her cramps Dysmenorrhea is characterized as central cramping with occasional left greater than right component; she does have perimenstrual low backache with radiation into her thighs Long history of deep thrusting dyspareunia, getting worse Long history of abnormal Pap smears; Pap/HPV negative/negative (06/17/2017)  Pelvic u/s done 01/23/2018 reveals: Findings: The uterus measures 8.5 x 6.8 x 4.7 cm. Echo texture is heterogeneous without evidence of focal masses. An anechoic area is noted within the myometrium of  the fundal region measuring 0.6 x 0.7 x 0.6 cm. A hyperechoic area is noted in the myometrium abutting the endometrium measuring 1.2 x 0.8 x 0.7 cm (? Polyp). Multiple nabothian cysts are noted throughout the cervix. The Endometriummeasures 11.0 mm and appears trilaminar. A hyperechoic area is noted within the endometrium measuring 0.5 x 0.4 x 0.3 cm.  Right Ovary measures 3.2 x 2.8 x 2.2 cm and contains a dominant follicle measuring 1.8 x 2.2 x 1.8 cm.  Left Ovary measures 3.1 x 1.5 x 1.3 cm. It is normal appearance. Survey of the adnexa demonstrates no adnexal masses. There is no free fluid in the cul de sac.   OB History    Gravida  3   Para  1   Term  1   Preterm      AB  2   Living  1     SAB      TAB  2   Ectopic      Multiple      Live Births  1        Obstetric Comments  1st Menstrual Cycle:  13 1st Pregnancy:  21         Patient's last menstrual period was 03/04/2018 (exact date).    Past Medical History:  Diagnosis Date  . Anxiety   . Colon polyp   . GERD (gastroesophageal reflux disease)     TUMS PRN  . Heavy periods   . 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 History of hiatal hernia   . Hyperlipidemia   . Hypothyroidism   . Irregular menses   . Pelvic pain in female   . Thyroid disease   . Vaginal Pap smear, abnormal   . Vitamin B12 deficiency anemia due to intrinsic factor deficiency  Past Surgical History:  Procedure Laterality Date  . BACK SURGERY  11/2017   Eduardo Osier  . CHOLECYSTECTOMY    . COLONOSCOPY  07/2017   in [place]/ colon polyps  . COLONOSCOPY  01/08/2018   Dr Vira Agar  . COLPOSCOPY    . GALLBLADDER SURGERY  2010  . HEMORRHOID SURGERY  07/2017   in [place]  . LAPAROSCOPY N/A 01/30/2018   Procedure: LAPAROSCOPY DIAGNOSTIC WITH BIOPSIES;  Surgeon: Brayton Mars, MD;  Location: [facility] OR[facility];  Service: Gynecology;  Laterality: N/A;  . PILONIDAL CYST EXCISION N/A 01/30/2018   Procedure: EXCISION ANAL POLYP;   Surgeon: Robert Bellow, MD;  Location: [facility] ORS;  Service: General;  Laterality: N/A;    OB History  Gravida Para Term Preterm AB Living  3 1 1   2 1   SAB TAB Ectopic Multiple Live Births    2     1    # Outcome Date GA Lbr Len/2nd Weight Sex Delivery Anes PTL Lv  3 Term 1995   6 lb 2.8 oz (2.8 kg) F Vag-Spont   LIV  2 TAB 1994          1 TAB 1993            Obstetric Comments  1st Menstrual Cycle:  13  1st Pregnancy:  21    Social History   Socioeconomic History  . Marital status: Married    Spouse name: Not on file  . Number of children: Not on file  . Years of education: Not on file  . Highest education level: Not on file  Occupational History  . Not on file  Social Needs  . Financial resource strain: Not on file  . Food insecurity:    Worry: Not on file    Inability: Not on file  . Transportation needs:    Medical: Not on file    Non-medical: Not on file  Tobacco Use  . Smoking status: Former Smoker    Packs/day: 0.50    Years: 23.00    Pack years: 11.50    Types: Cigarettes    Last attempt to quit: 01/27/2016    Years since quitting: 2.2  . Smokeless tobacco: Never Used  Substance and Sexual Activity  . Alcohol use: Yes    Comment: occas  . Drug use: No  . Sexual activity: Yes    Comment: vasectomy  Lifestyle  . Physical activity:    Days per week: Not on file    Minutes per session: Not on file  . Stress: Not on file  Relationships  . Social connections:    Talks on phone: Not on file    Gets together: Not on file    Attends religious service: Not on file    Active member of club or organization: Not on file    Attends meetings of clubs or organizations: Not on file    Relationship status: Not on file  Other Topics Concern  . Not on file  Social History Narrative  . Not on file    Family History  Problem Relation Age of Onset  . Cancer Maternal Grandfather        colon  . Hypertension Mother   . Thyroid disease Maternal Grandmother    . Diabetes Neg Hx   . Heart disease Neg Hx      (Not in a hospital admission)  No Known Allergies  Review of Systems Constitutional: No recent fever/chills/sweats Respiratory: No recent cough/bronchitis Cardiovascular: No chest  pain Gastrointestinal: No recent nausea/vomiting/diarrhea Genitourinary: No UTI symptoms Hematologic/lymphatic:No history of coagulopathy or recent blood thinner use    Objective:    BP 132/79   Pulse 76   Ht 5\' 2"  (1.575 m)   Wt 156 lb 6.4 oz (70.9 kg)   LMP 03/04/2018 (Exact Date)   BMI 28.61 kg/m   General:   Normal  Skin:   normal  HEENT:  Normal  Neck:  Supple without Adenopathy or Thyromegaly  Lungs:   Heart:              Breasts:   Abdomen:  Pelvis:  M/S   Extremeties:  Neuro:    clear to auscultation bilaterally   Normal without murmur   Not Examined   soft, non-tender; bowel sounds normal; no masses,  no organomegaly   Exam deferred to OR  No CVAT  Warm/Dry   Normal          Assessment:     1.  Symptomatic endometriosis 2.  Uterine fibroids   Plan:  [facility] BSO  Preop counseling: Patient is to undergo LAVH BSO for management of symptomatic endometriosis and uterine fibroids.  She is understanding of the planned procedures and is aware is aware of and is accepting of all surgical risks which include but are not limited to bleeding, infection, pelvic organ injury with need for repair, blood clot disorders, anesthesia risk, etc.  She understands that she may need estrogen replacement therapy for management of surgical menopause.  She is accepting of all risks and wishes to proceed with surgery as scheduled.  All questions are answered.  Brayton Mars, MD  Note: This dictation was prepared with Dragon dictation along with smaller phrase technology. Any transcriptional errors that result from this process are unintentional.

## 2018-04-28 NOTE — H&P (View-Only) (Signed)
PREOPERATIVE HISTORY AND PHYSICAL  Date of surgery: 05/04/2018 Diagnosis: Symptomatic endometriosis Procedure: [facility] BSO    Patient is a 47 y.o. G3P1041female scheduled for surgery on 05/04/2018. Status post laparoscopy on 01/30/2018 FINDINGS: Uterus top normal size with several small fibroids; bilateral ovaries are normal the right is evidence of cyst; fallopian tubes are normal; prominent uterosacral ligaments: Bladder flap peritoneum demonstrates a 56mm implant consistent with endometriosis; (excised).  No other obvious defects.  Appendix looks normal.  Upper abdomen appears normal. Bimanual exam demonstrates good uterine descensus contractions the pelvis which will enable hysterectomy to be performed by L[facility]; peritoneal biopsies did not demonstrate endometriosis; however, symptom complex is consistent with this disease state.  She does desire definitive surgery at this time.  Patient has history of severe dysmenorrhea, chronic pelvic pain, and uterine fibroids.  Work-up in [place] included pelvic ultrasound within the past year that showed uterine fibroids and changes consistent with adenomyosis.  Gynecologic history: Menarche-age 53 Intervals-every 3-4 weeks Duration-3-7 days Flow is heavy with clots; history of anemia as a teenager Long history of severe dysmenorrhea 100 out of a scale of 1-10!;  Patient has missed school and work because of severe pain; she has passed out from her cramps Dysmenorrhea is characterized as central cramping with occasional left greater than right component; she does have perimenstrual low backache with radiation into her thighs Long history of deep thrusting dyspareunia, getting worse Long history of abnormal Pap smears; Pap/HPV negative/negative (06/17/2017)  Pelvic u/s done 01/23/2018 reveals: Findings: The uterus measures 8.5 x 6.8 x 4.7 cm. Echo texture is heterogeneous without evidence of focal masses. An anechoic area is noted within the myometrium of  the fundal region measuring 0.6 x 0.7 x 0.6 cm. A hyperechoic area is noted in the myometrium abutting the endometrium measuring 1.2 x 0.8 x 0.7 cm (? Polyp). Multiple nabothian cysts are noted throughout the cervix. The Endometriummeasures 11.0 mm and appears trilaminar. A hyperechoic area is noted within the endometrium measuring 0.5 x 0.4 x 0.3 cm.  Right Ovary measures 3.2 x 2.8 x 2.2 cm and contains a dominant follicle measuring 1.8 x 2.2 x 1.8 cm.  Left Ovary measures 3.1 x 1.5 x 1.3 cm. It is normal appearance. Survey of the adnexa demonstrates no adnexal masses. There is no free fluid in the cul de sac.   OB History    Gravida  3   Para  1   Term  1   Preterm      AB  2   Living  1     SAB      TAB  2   Ectopic      Multiple      Live Births  1        Obstetric Comments  1st Menstrual Cycle:  13 1st Pregnancy:  21         Patient's last menstrual period was 03/04/2018 (exact date).    Past Medical History:  Diagnosis Date  . Anxiety   . Colon polyp   . GERD (gastroesophageal reflux disease)     TUMS PRN  . Heavy periods   . 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 History of hiatal hernia   . Hyperlipidemia   . Hypothyroidism   . Irregular menses   . Pelvic pain in female   . Thyroid disease   . Vaginal Pap smear, abnormal   . Vitamin B12 deficiency anemia due to intrinsic factor deficiency  Past Surgical History:  Procedure Laterality Date  . BACK SURGERY  11/2017   Eduardo Osier  . CHOLECYSTECTOMY    . COLONOSCOPY  07/2017   in [place]/ colon polyps  . COLONOSCOPY  01/08/2018   Dr Vira Agar  . COLPOSCOPY    . GALLBLADDER SURGERY  2010  . HEMORRHOID SURGERY  07/2017   in [place]  . LAPAROSCOPY N/A 01/30/2018   Procedure: LAPAROSCOPY DIAGNOSTIC WITH BIOPSIES;  Surgeon: Brayton Mars, MD;  Location: [facility];  Service: Gynecology;  Laterality: N/A;  . PILONIDAL CYST EXCISION N/A 01/30/2018   Procedure: EXCISION ANAL POLYP;   Surgeon: Robert Bellow, MD;  Location: [facility];  Service: General;  Laterality: N/A;    OB History  Gravida Para Term Preterm AB Living  3 1 1   2 1   SAB TAB Ectopic Multiple Live Births    2     1    # Outcome Date GA Lbr Len/2nd Weight Sex Delivery Anes PTL Lv  3 Term 1995   6 lb 2.8 oz (2.8 kg) F Vag-Spont   LIV  2 TAB 1994          1 TAB 1993            Obstetric Comments  1st Menstrual Cycle:  13  1st Pregnancy:  21    Social History   Socioeconomic History  . Marital status: Married    Spouse name: Not on file  . Number of children: Not on file  . Years of education: Not on file  . Highest education level: Not on file  Occupational History  . Not on file  Social Needs  . Financial resource strain: Not on file  . Food insecurity:    Worry: Not on file    Inability: Not on file  . Transportation needs:    Medical: Not on file    Non-medical: Not on file  Tobacco Use  . Smoking status: Former Smoker    Packs/day: 0.50    Years: 23.00    Pack years: 11.50    Types: Cigarettes    Last attempt to quit: 01/27/2016    Years since quitting: 2.2  . Smokeless tobacco: Never Used  Substance and Sexual Activity  . Alcohol use: Yes    Comment: occas  . Drug use: No  . Sexual activity: Yes    Comment: vasectomy  Lifestyle  . Physical activity:    Days per week: Not on file    Minutes per session: Not on file  . Stress: Not on file  Relationships  . Social connections:    Talks on phone: Not on file    Gets together: Not on file    Attends religious service: Not on file    Active member of club or organization: Not on file    Attends meetings of clubs or organizations: Not on file    Relationship status: Not on file  Other Topics Concern  . Not on file  Social History Narrative  . Not on file    Family History  Problem Relation Age of Onset  . Cancer Maternal Grandfather        colon  . Hypertension Mother   . Thyroid disease Maternal Grandmother    . Diabetes Neg Hx   . Heart disease Neg Hx      (Not in a hospital admission)  No Known Allergies  Review of Systems Constitutional: No recent fever/chills/sweats Respiratory: No recent cough/bronchitis Cardiovascular: No chest  pain Gastrointestinal: No recent nausea/vomiting/diarrhea Genitourinary: No UTI symptoms Hematologic/lymphatic:No history of coagulopathy or recent blood thinner use    Objective:    BP 132/79   Pulse 76   Ht 5\' 2"  (1.575 m)   Wt 156 lb 6.4 oz (70.9 kg)   LMP 03/04/2018 (Exact Date)   BMI 28.61 kg/m   General:   Normal  Skin:   normal  HEENT:  Normal  Neck:  Supple without Adenopathy or Thyromegaly  Lungs:   Heart:              Breasts:   Abdomen:  Pelvis:  M/S   Extremeties:  Neuro:    clear to auscultation bilaterally   Normal without murmur   Not Examined   soft, non-tender; bowel sounds normal; no masses,  no organomegaly   Exam deferred to OR  No CVAT  Warm/Dry   Normal          Assessment:     1.  Symptomatic endometriosis 2.  Uterine fibroids   Plan:  [facility] BSO  Preop counseling: Patient is to undergo L[facility] BSO for management of symptomatic endometriosis and uterine fibroids.  She is understanding of the planned procedures and is aware is aware of and is accepting of all surgical risks which include but are not limited to bleeding, infection, pelvic organ injury with need for repair, blood clot disorders, anesthesia risk, etc.  She understands that she may need estrogen replacement therapy for management of surgical menopause.  She is accepting of all risks and wishes to proceed with surgery as scheduled.  All questions are answered.  Brayton Mars, MD  Note: This dictation was prepared with Dragon dictation along with smaller phrase technology. Any transcriptional errors that result from this process are unintentional.

## 2018-04-28 NOTE — Patient Instructions (Signed)
Your procedure is scheduled on: Mon. 7/15 Report to Day Surgery. To find out your arrival time please call [phone] between 1PM - 3PM on Friday 7/12.  Remember: Instructions that are not followed completely may result in serious medical risk,  up to and including death, or upon the discretion of your surgeon and anesthesiologist your  surgery may need to be rescheduled.     _X__ 1. Do not eat food after midnight the night before your procedure.                 No gum chewing or hard candies. You may drink clear liquids up to 2 hours                 before you are scheduled to arrive for your surgery- DO not drink clear                 liquids within 2 hours of the start of your surgery.                 Clear Liquids include:  water, apple juice without pulp, clear carbohydrate                 drink such as Clearfast of Gatorade, Black Coffee or Tea (Do not add                 anything to coffee or tea).  __X__2.  On the morning of surgery brush your teeth with toothpaste and water, you                may rinse your mouth with mouthwash if you wish.  Do not swallow any toothpaste of mouthwash.     _X__ 3.  No Alcohol for 24 hours before or after surgery.   __ 4.  Do Not Smoke or use e-cigarettes For 24 Hours Prior to Your Surgery.                 Do not use any chewable tobacco products for at least 6 hours prior to                 surgery.  ____  5.  Bring all medications with you on the day of surgery if instructed.   __x__  6.  Notify your doctor if there is any change in your medical condition      (cold, fever, infections).     Do not wear jewelry, make-up, hairpins, clips or nail polish. Do not wear lotions, powders, or perfumes. You may wear deodorant. Do not shave 48 hours prior to surgery. Men may shave face and neck. Do not bring valuables to the hospital.    [facility] is not responsible for any belongings or valuables.  Contacts, dentures  or bridgework may not be worn into surgery. Leave your suitcase in the car. After surgery it may be brought to your room. For patients admitted to the hospital, discharge time is determined by your treatment team.   Patients discharged the day of surgery will not be allowed to drive home.   Please read over the following fact sheets that you were given:   _x___ Take these medicines the morning of surgery with A SIP OF WATER:    1. cyclobenzaprine (FLEXERIL) 10 MG tablet  2. levothyroxine (SYNTHROID, LEVOTHROID) 25 MCG tablet  3.   4.  5.  6.  ____ Fleet Enema (as directed)   __x__ Use CHG Soap as directed  ____ Use inhalers on the day of surgery  ____ Stop metformin 2 days prior to surgery    ____ Take 1/2 of usual insulin dose the night before surgery. No insulin the morning          of surgery.   ____ Stop Coumadin/Plavix/aspirin on   _x__ Stop Anti-inflammatories naproxen (NAPROSYN) 500 MG tablet today  Diclofenac Sodium 3 % GEL on Friday 7/12   ____ Stop supplements until after surgery.    ____ Bring C-Pap to the hospital.

## 2018-04-28 NOTE — H&P (Signed)
PREOPERATIVE HISTORY AND PHYSICAL  Date of surgery: 05/04/2018 Diagnosis: Symptomatic endometriosis Procedure: LAVH BSO    Evelyn Holloway is a 47 y.o. [id number] scheduled for surgery on 05/04/2018. Status post laparoscopy on 01/30/2018 FINDINGS: Uterus top normal size with several small fibroids; bilateral ovaries are normal the right is evidence of cyst; fallopian tubes are normal; prominent uterosacral ligaments: Bladder flap peritoneum demonstrates a 34mm implant consistent with endometriosis; (excised).  No other obvious defects.  Appendix looks normal.  Upper abdomen appears normal. Bimanual exam demonstrates good uterine descensus contractions the pelvis which will enable hysterectomy to be performed by LAVH; peritoneal biopsies did not demonstrate endometriosis; however, symptom complex is consistent with this disease state.  She does desire definitive surgery at this time.  Evelyn Holloway has history of severe dysmenorrhea, chronic pelvic pain, and uterine fibroids.  Work-up in [place] included pelvic ultrasound within the past year that showed uterine fibroids and changes consistent with adenomyosis.  Gynecologic history: Menarche-age 54 Intervals-every 3-4 weeks Duration-3-7 days Flow is heavy with clots; history of anemia as a teenager Long history of severe dysmenorrhea 100 out of a scale of 1-10!;  Evelyn Holloway has missed school and work because of severe pain; she has passed out from her cramps Dysmenorrhea is characterized as central cramping with occasional left greater than right component; she does have perimenstrual low backache with radiation into her thighs Long history of deep thrusting dyspareunia, getting worse Long history of abnormal Pap smears; Pap/HPV negative/negative (06/17/2017)  Pelvic u/s done 01/23/2018 reveals: Findings: The uterus measures 8.5 x 6.8 x 4.7 cm. Echo texture is heterogeneous without evidence of focal masses. An anechoic area is noted within the myometrium of  the fundal region measuring 0.6 x 0.7 x 0.6 cm. A hyperechoic area is noted in the myometrium abutting the endometrium measuring 1.2 x 0.8 x 0.7 cm (? Polyp). Multiple nabothian cysts are noted throughout the cervix. The Endometriummeasures 11.0 mm and appears trilaminar. A hyperechoic area is noted within the endometrium measuring 0.5 x 0.4 x 0.3 cm.  Right Ovary measures 3.2 x 2.8 x 2.2 cm and contains a dominant follicle measuring 1.8 x 2.2 x 1.8 cm.  Left Ovary measures 3.1 x 1.5 x 1.3 cm. It is normal appearance. Survey of the adnexa demonstrates no adnexal masses. There is no free fluid in the cul de sac.   OB History    Gravida  3   Para  1   Term  1   Preterm      AB  2   Living  1     SAB      TAB  2   Ectopic      Multiple      Live Births  1        Obstetric Comments  1st Menstrual Cycle:  13 1st Pregnancy:  21         Evelyn Holloway's last menstrual period was 03/04/2018 (exact date).    Past Medical History:  Diagnosis Date  . Anxiety   . Colon polyp   . GERD (gastroesophageal reflux disease)     TUMS PRN  . Heavy periods   . 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 History of hiatal hernia   . Hyperlipidemia   . Hypothyroidism   . Irregular menses   . Pelvic pain in female   . Thyroid disease   . Vaginal Pap smear, abnormal   . Vitamin B12 deficiency anemia due to intrinsic factor deficiency  Past Surgical History:  Procedure Laterality Date  . BACK SURGERY  11/2017   Eduardo Osier  . CHOLECYSTECTOMY    . COLONOSCOPY  07/2017   in [place]/ colon polyps  . COLONOSCOPY  01/08/2018   Dr Vira Agar  . COLPOSCOPY    . GALLBLADDER SURGERY  2010  . HEMORRHOID SURGERY  07/2017   in [place]  . LAPAROSCOPY N/A 01/30/2018   Procedure: LAPAROSCOPY DIAGNOSTIC WITH BIOPSIES;  Surgeon: Brayton Mars, MD;  Location: [facility] ORS;  Service: Gynecology;  Laterality: N/A;  . PILONIDAL CYST EXCISION N/A 01/30/2018   Procedure: EXCISION ANAL POLYP;   Surgeon: Robert Bellow, MD;  Location: [facility] ORS;  Service: General;  Laterality: N/A;    OB History  Gravida Para Term Preterm AB Living  3 1 1   2 1   SAB TAB Ectopic Multiple Live Births    2     1    # Outcome Date GA Lbr Len/2nd Weight Sex Delivery Anes PTL Lv  3 Term 1995   6 lb 2.8 oz (2.8 kg) F Vag-Spont   LIV  2 TAB 1994          1 TAB 1993            Obstetric Comments  1st Menstrual Cycle:  13  1st Pregnancy:  21    Social History   Socioeconomic History  . Marital status: Married    Spouse name: Not on file  . Number of children: Not on file  . Years of education: Not on file  . Highest education level: Not on file  Occupational History  . Not on file  Social Needs  . Financial resource strain: Not on file  . Food insecurity:    Worry: Not on file    Inability: Not on file  . Transportation needs:    Medical: Not on file    Non-medical: Not on file  Tobacco Use  . Smoking status: Former Smoker    Packs/day: 0.50    Years: 23.00    Pack years: 11.50    Types: Cigarettes    Last attempt to quit: 01/27/2016    Years since quitting: 2.2  . Smokeless tobacco: Never Used  Substance and Sexual Activity  . Alcohol use: Yes    Comment: occas  . Drug use: No  . Sexual activity: Yes    Comment: vasectomy  Lifestyle  . Physical activity:    Days per week: Not on file    Minutes per session: Not on file  . Stress: Not on file  Relationships  . Social connections:    Talks on phone: Not on file    Gets together: Not on file    Attends religious service: Not on file    Active member of club or organization: Not on file    Attends meetings of clubs or organizations: Not on file    Relationship status: Not on file  Other Topics Concern  . Not on file  Social History Narrative  . Not on file    Family History  Problem Relation Age of Onset  . Cancer Maternal Grandfather        colon  . Hypertension Mother   . Thyroid disease Maternal Grandmother    . Diabetes Neg Hx   . Heart disease Neg Hx      (Not in a hospital admission)  No Known Allergies  Review of Systems Constitutional: No recent fever/chills/sweats Respiratory: No recent cough/bronchitis Cardiovascular: No chest  pain Gastrointestinal: No recent nausea/vomiting/diarrhea Genitourinary: No UTI symptoms Hematologic/lymphatic:No history of coagulopathy or recent blood thinner use    Objective:    BP 132/79   Pulse 76   Ht 5\' 2"  (1.575 m)   Wt 156 lb 6.4 oz (70.9 kg)   LMP 03/04/2018 (Exact Date)   BMI 28.61 kg/m   General:   Normal  Skin:   normal  HEENT:  Normal  Neck:  Supple without Adenopathy or Thyromegaly  Lungs:   Heart:              Breasts:   Abdomen:  Pelvis:  M/S   Extremeties:  Neuro:    clear to auscultation bilaterally   Normal without murmur   Not Examined   soft, non-tender; bowel sounds normal; no masses,  no organomegaly   Exam deferred to OR  No CVAT  Warm/Dry   Normal          Assessment:     1.  Symptomatic endometriosis 2.  Uterine fibroids   Plan:  LAVH BSO  Preop counseling: Evelyn Holloway is to undergo LAVH BSO for management of symptomatic endometriosis and uterine fibroids.  She is understanding of the planned procedures and is aware is aware of and is accepting of all surgical risks which include but are not limited to bleeding, infection, pelvic organ injury with need for repair, blood clot disorders, anesthesia risk, etc.  She understands that she may need estrogen replacement therapy for management of surgical menopause.  She is accepting of all risks and wishes to proceed with surgery as scheduled.  All questions are answered.  Brayton Mars, MD  Note: This dictation was prepared with Dragon dictation along with smaller phrase technology. Any transcriptional errors that result from this process are unintentional.

## 2018-04-28 NOTE — Patient Instructions (Signed)
1.  Return for postop check on 05/14/2018  Laparoscopically Assisted Vaginal Hysterectomy, Care After Refer to this sheet in the next few weeks. These instructions provide you with information on caring for yourself after your procedure. Your health care provider may also give you more specific instructions. Your treatment has been planned according to current medical practices, but problems sometimes occur. Call your health care provider if you have any problems or questions after your procedure. What can I expect after the procedure? After your procedure, it is typical to have the following:  Abdominal pain. You will be given pain medicine to control it.  Sore throat from the breathing tube that was inserted during surgery.  Follow these instructions at home:  Only take over-the-counter or prescription medicines for pain, discomfort, or fever as directed by your health care provider.  Do not take aspirin. It can cause bleeding.  Do not drive when taking pain medicine.  Follow your health care provider's advice regarding diet, exercise, lifting, driving, and general activities.  Resume your usual diet as directed and allowed.  Get plenty of rest and sleep.  Do not douche, use tampons, or have sexual intercourse for at least 6 weeks, or until your health care provider gives you permission.  Change your bandages (dressings) as directed by your health care provider.  Monitor your temperature and notify your health care provider of a fever.  Take showers instead of baths for 2-3 weeks.  Do not drink alcohol until your health care provider gives you permission.  If you develop constipation, you may take a mild laxative with your health care provider's permission. Bran foods may help with constipation problems. Drinking enough fluids to keep your urine clear or pale yellow may help as well.  Try to have someone home with you for 1-2 weeks to help around the house.  Keep all of your  follow-up appointments as directed by your health care provider. Contact a health care provider if:  You have swelling, redness, or increasing pain around your incision sites.  You have pus coming from your incision.  You notice a bad smell coming from your incision.  Your incision breaks open.  You feel dizzy or lightheaded.  You have pain or bleeding when you urinate.  You have persistent diarrhea.  You have persistent nausea and vomiting.  You have abnormal vaginal discharge.  You have a rash.  You have any type of abnormal reaction or develop an allergy to your medicine.  You have poor pain control with your prescribed medicine. Get help right away if:  You have a fever.  You have severe abdominal pain.  You have chest pain.  You have shortness of breath.  You faint.  You have pain, swelling, or redness in your leg.  You have heavy vaginal bleeding with blood clots. This information is not intended to replace advice given to you by your health care provider. Make sure you discuss any questions you have with your health care provider. Document Released: 09/26/2011 Document Revised: 03/14/2016 Document Reviewed: 04/22/2013 Elsevier Interactive Patient Education  2017 [employer].

## 2018-05-03 ENCOUNTER — Encounter: Payer: Self-pay | Admitting: Anesthesiology

## 2018-05-04 ENCOUNTER — Observation Stay
Admission: RE | Admit: 2018-05-04 | Discharge: 2018-05-05 | Disposition: A | Discharge status: Home / Self Care | Payer: BLUE CROSS/BLUE SHIELD | Source: Ambulatory Visit | Attending: Obstetrics and Gynecology | Admitting: Obstetrics and Gynecology

## 2018-05-04 ENCOUNTER — Ambulatory Visit: Payer: BLUE CROSS/BLUE SHIELD | Admitting: Certified Registered"

## 2018-05-04 ENCOUNTER — Encounter
Admission: RE | Disposition: A | Discharge status: Home / Self Care | Payer: Self-pay | Source: Ambulatory Visit | Attending: Obstetrics and Gynecology

## 2018-05-04 ENCOUNTER — Other Ambulatory Visit: Payer: Self-pay

## 2018-05-04 DIAGNOSIS — D271 Benign neoplasm of left ovary: Secondary | ICD-10-CM | POA: Diagnosis not present

## 2018-05-04 DIAGNOSIS — Z87891 Personal history of nicotine dependence: Secondary | ICD-10-CM | POA: Insufficient documentation

## 2018-05-04 DIAGNOSIS — N809 Endometriosis, unspecified: Secondary | ICD-10-CM

## 2018-05-04 DIAGNOSIS — N83202 Unspecified ovarian cyst, left side: Secondary | ICD-10-CM | POA: Diagnosis not present

## 2018-05-04 DIAGNOSIS — E785 Hyperlipidemia, unspecified: Secondary | ICD-10-CM | POA: Diagnosis not present

## 2018-05-04 DIAGNOSIS — N83201 Unspecified ovarian cyst, right side: Secondary | ICD-10-CM | POA: Insufficient documentation

## 2018-05-04 DIAGNOSIS — N920 Excessive and frequent menstruation with regular cycle: Secondary | ICD-10-CM | POA: Diagnosis not present

## 2018-05-04 DIAGNOSIS — Z79899 Other long term (current) drug therapy: Secondary | ICD-10-CM | POA: Diagnosis not present

## 2018-05-04 DIAGNOSIS — N858 Other specified noninflammatory disorders of uterus: Secondary | ICD-10-CM | POA: Diagnosis not present

## 2018-05-04 DIAGNOSIS — D252 Subserosal leiomyoma of uterus: Secondary | ICD-10-CM | POA: Insufficient documentation

## 2018-05-04 DIAGNOSIS — E039 Hypothyroidism, unspecified: Secondary | ICD-10-CM | POA: Insufficient documentation

## 2018-05-04 DIAGNOSIS — F419 Anxiety disorder, unspecified: Secondary | ICD-10-CM | POA: Diagnosis not present

## 2018-05-04 DIAGNOSIS — K7589 Other specified inflammatory liver diseases: Secondary | ICD-10-CM | POA: Insufficient documentation

## 2018-05-04 DIAGNOSIS — N800 Endometriosis of uterus: Principal | ICD-10-CM | POA: Insufficient documentation

## 2018-05-04 DIAGNOSIS — Z90710 Acquired absence of both cervix and uterus: Secondary | ICD-10-CM

## 2018-05-04 DIAGNOSIS — N946 Dysmenorrhea, unspecified: Secondary | ICD-10-CM | POA: Diagnosis not present

## 2018-05-04 DIAGNOSIS — K219 Gastro-esophageal reflux disease without esophagitis: Secondary | ICD-10-CM | POA: Diagnosis not present

## 2018-05-04 DIAGNOSIS — D259 Leiomyoma of uterus, unspecified: Secondary | ICD-10-CM | POA: Diagnosis not present

## 2018-05-04 DIAGNOSIS — Z79890 Hormone replacement therapy (postmenopausal): Secondary | ICD-10-CM | POA: Insufficient documentation

## 2018-05-04 HISTORY — PX: LAPAROSCOPIC VAGINAL HYSTERECTOMY WITH SALPINGO OOPHORECTOMY: SHX6681

## 2018-05-04 LAB — POCT PREGNANCY, URINE: Preg Test, Ur: NEGATIVE

## 2018-05-04 SURGERY — HYSTERECTOMY, VAGINAL, LAPAROSCOPY-ASSISTED, WITH SALPINGO-OOPHORECTOMY
Anesthesia: General | Laterality: Bilateral

## 2018-05-04 MED ORDER — PROPOFOL 10 MG/ML IV BOLUS
INTRAVENOUS | Status: DC | PRN
  Administered 2018-05-04: 50 mg via INTRAVENOUS
  Administered 2018-05-04: 150 mg via INTRAVENOUS
  Administered 2018-05-04: 50 mg via INTRAVENOUS

## 2018-05-04 MED ORDER — LIDOCAINE HCL (PF) 2 % IJ SOLN
INTRAMUSCULAR | Status: AC
  Filled 2018-05-04: qty 10

## 2018-05-04 MED ORDER — DEXMEDETOMIDINE HCL IN NACL 80 MCG/20ML IV SOLN
INTRAVENOUS | Status: AC
  Filled 2018-05-04: qty 20

## 2018-05-04 MED ORDER — CEFAZOLIN SODIUM-DEXTROSE 2-4 GM/100ML-% IV SOLN
2.0000 g | INTRAVENOUS | Status: AC
  Administered 2018-05-04: 2 g via INTRAVENOUS

## 2018-05-04 MED ORDER — CEFAZOLIN SODIUM-DEXTROSE 2-4 GM/100ML-% IV SOLN
INTRAVENOUS | Status: AC
  Filled 2018-05-04: qty 100

## 2018-05-04 MED ORDER — DOCUSATE SODIUM 100 MG PO CAPS
100.0000 mg | ORAL_CAPSULE | Freq: Two times a day (BID) | ORAL | Status: DC
  Administered 2018-05-04 – 2018-05-05 (×3): 100 mg via ORAL
  Filled 2018-05-04 (×3): qty 1

## 2018-05-04 MED ORDER — KETOROLAC TROMETHAMINE 30 MG/ML IJ SOLN: 30.0000 mg | Freq: Four times a day (QID) | INTRAMUSCULAR | Status: DC

## 2018-05-04 MED ORDER — ACETAMINOPHEN 500 MG PO TABS
1000.0000 mg | ORAL_TABLET | Freq: Four times a day (QID) | ORAL | Status: DC
  Administered 2018-05-04 – 2018-05-05 (×3): 1000 mg via ORAL
  Filled 2018-05-04 (×3): qty 2

## 2018-05-04 MED ORDER — ONDANSETRON HCL 4 MG/2ML IJ SOLN
INTRAMUSCULAR | Status: AC
  Filled 2018-05-04: qty 2

## 2018-05-04 MED ORDER — SIMETHICONE 80 MG PO CHEW
80.0000 mg | CHEWABLE_TABLET | Freq: Four times a day (QID) | ORAL | Status: DC | PRN
  Administered 2018-05-04: 80 mg via ORAL
  Filled 2018-05-04: qty 1

## 2018-05-04 MED ORDER — MIDAZOLAM HCL 2 MG/2ML IJ SOLN
INTRAMUSCULAR | Status: DC | PRN
  Administered 2018-05-04: 2 mg via INTRAVENOUS

## 2018-05-04 MED ORDER — ROCURONIUM BROMIDE 100 MG/10ML IV SOLN
INTRAVENOUS | Status: DC | PRN
  Administered 2018-05-04: 30 mg via INTRAVENOUS
  Administered 2018-05-04: 10 mg via INTRAVENOUS

## 2018-05-04 MED ORDER — BELLADONNA ALKALOIDS-OPIUM 16.2-60 MG RE SUPP
1.0000 | Freq: Once | RECTAL | Status: AC
  Administered 2018-05-04: 1 via RECTAL
  Filled 2018-05-04: qty 1

## 2018-05-04 MED ORDER — KETOROLAC TROMETHAMINE 30 MG/ML IJ SOLN
INTRAMUSCULAR | Status: AC
  Filled 2018-05-04: qty 1

## 2018-05-04 MED ORDER — ONDANSETRON HCL 4 MG/2ML IJ SOLN
INTRAMUSCULAR | Status: DC | PRN
  Administered 2018-05-04: 4 mg via INTRAVENOUS

## 2018-05-04 MED ORDER — LIDOCAINE HCL (CARDIAC) PF 100 MG/5ML IV SOSY
PREFILLED_SYRINGE | INTRAVENOUS | Status: DC | PRN
  Administered 2018-05-04: 60 mg via INTRAVENOUS

## 2018-05-04 MED ORDER — GLYCOPYRROLATE 0.2 MG/ML IJ SOLN
INTRAMUSCULAR | Status: DC | PRN
  Administered 2018-05-04: 0.2 mg via INTRAVENOUS

## 2018-05-04 MED ORDER — DEXMEDETOMIDINE HCL IN NACL 200 MCG/50ML IV SOLN
INTRAVENOUS | Status: DC | PRN
  Administered 2018-05-04: 12 ug via INTRAVENOUS

## 2018-05-04 MED ORDER — ROCURONIUM BROMIDE 50 MG/5ML IV SOLN
INTRAVENOUS | Status: AC
  Filled 2018-05-04: qty 1

## 2018-05-04 MED ORDER — FAMOTIDINE 20 MG PO TABS
20.0000 mg | ORAL_TABLET | Freq: Once | ORAL | Status: AC
  Administered 2018-05-04: 20 mg via ORAL

## 2018-05-04 MED ORDER — LACTATED RINGERS IV SOLN
INTRAVENOUS | Status: DC
  Administered 2018-05-04: 16:00:00 via INTRAVENOUS

## 2018-05-04 MED ORDER — MIDAZOLAM HCL 2 MG/2ML IJ SOLN
1.0000 mg | Freq: Once | INTRAMUSCULAR | Status: AC
  Administered 2018-05-04: 1 mg via INTRAVENOUS

## 2018-05-04 MED ORDER — GLYCOPYRROLATE 0.2 MG/ML IJ SOLN
INTRAMUSCULAR | Status: AC
  Filled 2018-05-04: qty 1

## 2018-05-04 MED ORDER — ACETAMINOPHEN 10 MG/ML IV SOLN
INTRAVENOUS | Status: DC | PRN
  Administered 2018-05-04: 1000 mg via INTRAVENOUS

## 2018-05-04 MED ORDER — KETOROLAC TROMETHAMINE 30 MG/ML IJ SOLN
30.0000 mg | Freq: Four times a day (QID) | INTRAMUSCULAR | Status: DC
  Administered 2018-05-04 – 2018-05-05 (×3): 30 mg via INTRAVENOUS
  Filled 2018-05-04 (×3): qty 1

## 2018-05-04 MED ORDER — MORPHINE SULFATE (PF) 2 MG/ML IV SOLN: 1.0000 mg | INTRAVENOUS | Status: DC | PRN

## 2018-05-04 MED ORDER — EPHEDRINE SULFATE 50 MG/ML IJ SOLN
INTRAMUSCULAR | Status: DC | PRN
  Administered 2018-05-04: 10 mg via INTRAVENOUS

## 2018-05-04 MED ORDER — FENTANYL CITRATE (PF) 100 MCG/2ML IJ SOLN
INTRAMUSCULAR | Status: AC
  Administered 2018-05-04: 25 ug via INTRAVENOUS
  Filled 2018-05-04: qty 2

## 2018-05-04 MED ORDER — LACTATED RINGERS IV SOLN
INTRAVENOUS | Status: DC
  Administered 2018-05-04: 1000 mL via INTRAVENOUS
  Administered 2018-05-04: 12:00:00 via INTRAVENOUS

## 2018-05-04 MED ORDER — SUGAMMADEX SODIUM 200 MG/2ML IV SOLN
INTRAVENOUS | Status: AC
  Filled 2018-05-04: qty 2

## 2018-05-04 MED ORDER — KETOROLAC TROMETHAMINE 30 MG/ML IJ SOLN
INTRAMUSCULAR | Status: DC | PRN
  Administered 2018-05-04: 30 mg via INTRAVENOUS

## 2018-05-04 MED ORDER — FENTANYL CITRATE (PF) 100 MCG/2ML IJ SOLN
INTRAMUSCULAR | Status: DC | PRN
  Administered 2018-05-04 (×4): 50 ug via INTRAVENOUS

## 2018-05-04 MED ORDER — TRAMADOL HCL 50 MG PO TABS
50.0000 mg | ORAL_TABLET | Freq: Four times a day (QID) | ORAL | Status: DC | PRN
  Administered 2018-05-04: 50 mg via ORAL
  Filled 2018-05-04: qty 1

## 2018-05-04 MED ORDER — DEXAMETHASONE SODIUM PHOSPHATE 10 MG/ML IJ SOLN
INTRAMUSCULAR | Status: DC | PRN
  Administered 2018-05-04: 10 mg via INTRAVENOUS

## 2018-05-04 MED ORDER — ONDANSETRON HCL 4 MG/2ML IJ SOLN: 4.0000 mg | Freq: Once | INTRAMUSCULAR | Status: DC | PRN

## 2018-05-04 MED ORDER — ONDANSETRON HCL 4 MG/2ML IJ SOLN: 4.0000 mg | Freq: Four times a day (QID) | INTRAMUSCULAR | Status: DC | PRN

## 2018-05-04 MED ORDER — ACETAMINOPHEN NICU IV SYRINGE 10 MG/ML
INTRAVENOUS | Status: AC
  Filled 2018-05-04: qty 1

## 2018-05-04 MED ORDER — MIDAZOLAM HCL 2 MG/2ML IJ SOLN
INTRAMUSCULAR | Status: AC
  Administered 2018-05-04: 1 mg via INTRAVENOUS
  Filled 2018-05-04: qty 2

## 2018-05-04 MED ORDER — DEXAMETHASONE SODIUM PHOSPHATE 10 MG/ML IJ SOLN
INTRAMUSCULAR | Status: AC
  Filled 2018-05-04: qty 1

## 2018-05-04 MED ORDER — FAMOTIDINE 20 MG PO TABS
ORAL_TABLET | ORAL | Status: AC
  Administered 2018-05-04: 20 mg via ORAL
  Filled 2018-05-04: qty 1

## 2018-05-04 MED ORDER — SUGAMMADEX SODIUM 200 MG/2ML IV SOLN
INTRAVENOUS | Status: DC | PRN
  Administered 2018-05-04: 150 mg via INTRAVENOUS

## 2018-05-04 MED ORDER — FENTANYL CITRATE (PF) 100 MCG/2ML IJ SOLN
25.0000 ug | INTRAMUSCULAR | Status: AC | PRN
  Administered 2018-05-04 (×6): 25 ug via INTRAVENOUS

## 2018-05-04 MED ORDER — FENTANYL CITRATE (PF) 250 MCG/5ML IJ SOLN
INTRAMUSCULAR | Status: AC
  Filled 2018-05-04: qty 5

## 2018-05-04 MED ORDER — MIDAZOLAM HCL 2 MG/2ML IJ SOLN
INTRAMUSCULAR | Status: AC
  Filled 2018-05-04: qty 2

## 2018-05-04 SURGICAL SUPPLY — 49 items
"PENCIL ELECTRO HAND CTR " (MISCELLANEOUS) ×1 IMPLANT
BAG URINE DRAINAGE (UROLOGICAL SUPPLIES) ×3 IMPLANT
BLADE SURG SZ11 CARB STEEL (BLADE) ×3 IMPLANT
CATH FOLEY 2WAY  5CC 16FR (CATHETERS) ×2
CATH URTH 16FR FL 2W BLN LF (CATHETERS) ×1 IMPLANT
CHLORAPREP W/TINT 26ML (MISCELLANEOUS) ×3 IMPLANT
CORD MONOPOLAR M/FML 12FT (MISCELLANEOUS) ×1 IMPLANT
DERMABOND ADVANCED (GAUZE/BANDAGES/DRESSINGS) ×2
DERMABOND ADVANCED .7 DNX12 (GAUZE/BANDAGES/DRESSINGS) ×1 IMPLANT
DRSG TEGADERM 2-3/8X2-3/4 SM (GAUZE/BANDAGES/DRESSINGS) ×9 IMPLANT
ELECT REM PT RETURN 9FT ADLT (ELECTROSURGICAL) ×3
ELECTRODE REM PT RTRN 9FT ADLT (ELECTROSURGICAL) ×1 IMPLANT
GAUZE PETRO XEROFOAM 1X8 (MISCELLANEOUS) ×1 IMPLANT
GLOVE BIO SURGEON STRL SZ 6.5 (GLOVE) ×4 IMPLANT
GLOVE BIO SURGEON STRL SZ7.5 (GLOVE) ×4 IMPLANT
GLOVE BIO SURGEON STRL SZ8 (GLOVE) ×19 IMPLANT
GLOVE BIO SURGEONS STRL SZ 6.5 (GLOVE) ×2
GLOVE BIOGEL PI ORTHO PRO 7.5 (GLOVE) ×4
GLOVE INDICATOR 7.0 STRL GRN (GLOVE) ×3 IMPLANT
GLOVE PI ORTHO PRO STRL 7.5 (GLOVE) IMPLANT
GOWN STRL REUS W/ TWL LRG LVL3 (GOWN DISPOSABLE) ×2 IMPLANT
GOWN STRL REUS W/ TWL XL LVL3 (GOWN DISPOSABLE) ×1 IMPLANT
GOWN STRL REUS W/TWL LRG LVL3 (GOWN DISPOSABLE) ×4
GOWN STRL REUS W/TWL XL LVL3 (GOWN DISPOSABLE) ×2
IRRIGATION STRYKERFLOW (MISCELLANEOUS) ×1 IMPLANT
IRRIGATOR STRYKERFLOW (MISCELLANEOUS)
IV LACTATED RINGERS 1000ML (IV SOLUTION) ×3 IMPLANT
KIT PINK PAD W/HEAD ARE REST (MISCELLANEOUS) ×3
KIT PINK PAD W/HEAD ARM REST (MISCELLANEOUS) ×1 IMPLANT
KIT TURNOVER CYSTO (KITS) ×3 IMPLANT
LABEL OR SOLS (LABEL) ×3 IMPLANT
PACK BASIN MINOR ARMC (MISCELLANEOUS) ×3 IMPLANT
PACK GYN LAPAROSCOPIC (MISCELLANEOUS) ×3 IMPLANT
PAD OB MATERNITY 4.3X12.25 (PERSONAL CARE ITEMS) ×3 IMPLANT
PENCIL ELECTRO HAND CTR (MISCELLANEOUS) ×3 IMPLANT
SCISSORS METZENBAUM CVD 33 (INSTRUMENTS) IMPLANT
SHEARS HARMONIC ACE PLUS 36CM (ENDOMECHANICALS) ×3 IMPLANT
SLEEVE ENDOPATH XCEL 5M (ENDOMECHANICALS) ×6 IMPLANT
SUT CHROMIC 2 0 CT 1 (SUTURE) ×2 IMPLANT
SUT MNCRL 4-0 (SUTURE) ×2
SUT MNCRL 4-0 27XMFL (SUTURE) ×1
SUT VIC AB 0 CT1 27 (SUTURE) ×2
SUT VIC AB 0 CT1 27XCR 8 STRN (SUTURE) IMPLANT
SUT VIC AB 0 CT1 36 (SUTURE) ×5 IMPLANT
SUTURE MNCRL 4-0 27XMF (SUTURE) ×1 IMPLANT
SYR 10ML LL (SYRINGE) ×3 IMPLANT
TAPE TRANSPORE STRL 2 31045 (GAUZE/BANDAGES/DRESSINGS) ×3 IMPLANT
TROCAR XCEL NON-BLD 5MMX100MML (ENDOMECHANICALS) ×3 IMPLANT
TUBING INSUF HEATED (TUBING) ×3 IMPLANT

## 2018-05-04 NOTE — Anesthesia Procedure Notes (Signed)
Procedure Name: Intubation Performed by: Rolla Plate, CRNA Pre-anesthesia Checklist: Patient identified, Patient being monitored, Timeout performed, Emergency Drugs available and Suction available Patient Re-evaluated:Patient Re-evaluated prior to induction Oxygen Delivery Method: Circle system utilized Preoxygenation: Pre-oxygenation with 100% oxygen Induction Type: IV induction Ventilation: Mask ventilation without difficulty Laryngoscope Size: Mac and 3 Grade View: Grade I Tube type: Oral Tube size: 7.0 mm Number of attempts: 1 Airway Equipment and Method: Stylet Placement Confirmation: ETT inserted through vocal cords under direct vision,  positive ETCO2 and breath sounds checked- equal and bilateral Secured at: 18 (lip) cm Tube secured with: Tape Dental Injury: Teeth and Oropharynx as per pre-operative assessment

## 2018-05-04 NOTE — Op Note (Addendum)
OPERATIVE NOTE:  Evelyn Holloway PROCEDURE DATE: 05/04/2018   PREOPERATIVE DIAGNOSIS: 1.  Symptomatic endometriosis (severe dysmenorrhea; menorrhagia) 2.  Uterine fibroid  POSTOPERATIVE DIAGNOSIS:  1.  Symptomatic endometriosis (severe dysmenorrhea; menorrhagia) 2.  Uterine fibroid  PROCEDURE: LAVH BSO SURGEON:  Brayton Mars, MD ASSISTANTS: Jeannie Fend, MD and PA-S [address] Filter ANESTHESIA: General INDICATIONS: 47 y.o. [id number] with long history of symptomatic endometriosis including severe dysmenorrhea refractory to conservative medical therapy and menorrhagia with chronic anemia.  Patient desires definitive surgery at this time previous laparoscopy demonstrated endometriosis.  FINDINGS: Powder burn lesions in the cul-de-sac and left ovarian fossa; white lesion on right uterosacral ligament; grossly normal uterus tubes and ovaries; right ovary had simple ovarian cyst; upper abdomen normal   I/O's: Total I/O In: -  Out: 300 [Urine:200; Blood:100] COUNTS:  YES SPECIMENS: Uterus with cervix, bilateral fallopian tubes and bilateral ovaries ANTIBIOTIC PROPHYLAXIS:Ancef 2 grams COMPLICATIONS: None immediate  PROCEDURE IN DETAIL: Patient was brought to the operating room placed in supine position.  General endotracheal anesthesia was induced that difficulty.  She was placed in the dorsolithotomy position using the bumblebee stirrups.  A ChloraPrep and Hibiclens abdominal perineal and intravaginal prep and drape was performed in standard fashion. Timeout was completed. Foley catheter was placed into the bladder and was draining clear yellow urine.  Hulka tenaculum was placed on the cervix to facilitate uterine manipulation. Due to the complex nature of the procedure and no competent first assistant available, Dr. Jeannie Fend assisted in the case. Subumbilical vertical incision 5 mm in length was made.  The Optiview laparoscopic trocar system was used to place the 5 mm  camera directly into the abdominal pelvic cavity without evidence of bowel or vascular injury.  2 other 5 mm ports were placed in the right and left lower quadrants respectively under direct visualization.  The laparoscopic portion of the hysterectomy was performed using the Ace harmonic scalpel, Kleppinger bipolar forceps and graspers.  The ureters were identified intraoperatively and were noted to be away from the operative field.  The infundibulopelvic  ligament was grasped coagulated and cut.  The mesosalpinx was grasped coagulated and cut.  The round ligament was grasped coagulated and cut.  The anterior and posterior leaves of the broad ligament were opened and the cardinal/broad ligament complex was skeletonized to expose the uterine arteries.  The bladder flap was created using sharp and blunt dissection.  The uterine arteries were cauterized with Kleppinger bipolar forceps and then transected using the Ace harmonic scalpel.  The procedure was performed bilaterally.  Once the laparoscopic portion of the hysterectomy was completed, the procedure was turned vaginally. Double-tooth tenaculum was placed under the cervix with removal of the Hulka tenaculum.  A weighted speculum was placed into the vagina.  Posterior colpotomy was made with Mayo scissors.  Uterosacral ligaments were clamped cut and stick tied using 0 Vicryl suture.  The vagina was incised using the Bovie cautery and the bladder was dissected off the lower uterine segment through sharp and blunt dissection.  Eventually the anterior cul-de-sac was entered.  The remainder of the cardinal broad ligament complexes were clamped coagulated and cut.  They were stick tied with 0 Vicryl suture.  Eventually the specimen was removed from the operative field.  The posterior cuff was run using a baseball stitch of 0 Vicryl.  The peritoneum and culdoplasty was performed with 0 Vicryl in a simple pursestring manner.  The vagina was then closed with simple  interrupted sutures of 2-0  chromic.  Upon completion of the vaginal portion of the hysterectomy, repeat laparoscopy was performed.  The left infundibulopelvic ligament pedicle had some slight bleeding and this was controlled with Kleppinger bipolar forceps.  Pelvis was irrigated with saline and the irrigant fluid was aspirated.  Ureters demonstrated normal peristalsis at the end of the procedure.  Once satisfied with hemostasis all instrumentation was removed from the abdominal pelvic cavity.  Pneumoperitoneum was released.  The incisions were closed with 4-0 Monocryl simple interrupted sutures and Dermabond glue.  Patient was awakened mobilized and taken recovery in satisfactory condition.  Evelyn Holloway A. Zipporah Plants, MD, [facility]

## 2018-05-04 NOTE — Anesthesia Post-op Follow-up Note (Signed)
Anesthesia QCDR form completed.        

## 2018-05-04 NOTE — OR Nursing (Signed)
Patient is anxious and Dr. Marcello Moores order one of versed in pre op.  Patient procedure is delayed due to c section, her and family notified

## 2018-05-04 NOTE — Anesthesia Preprocedure Evaluation (Signed)
Anesthesia Evaluation  Patient identified by MRN, date of birth, ID band Patient awake    Reviewed: Allergy & Precautions, NPO status , Patient's Chart, lab work & pertinent test results, reviewed documented beta blocker date and time   Airway Mallampati: II  TM Distance: >3 FB     Dental  (+) Chipped   Pulmonary former smoker,           Cardiovascular      Neuro/Psych PSYCHIATRIC DISORDERS Anxiety    GI/Hepatic hiatal hernia, GERD  Controlled,(+) Hepatitis -, A  Endo/Other  Hypothyroidism   Renal/GU      Musculoskeletal   Abdominal   Peds  Hematology  (+) anemia ,   Anesthesia Other Findings EKG ok. PTSD.  Reproductive/Obstetrics                             Anesthesia Physical Anesthesia Plan  ASA: III  Anesthesia Plan: General   Post-op Pain Management:    Induction: Intravenous  PONV Risk Score and Plan:   Airway Management Planned: Oral ETT  Additional Equipment:   Intra-op Plan:   Post-operative Plan:   Informed Consent: I have reviewed the patients History and Physical, chart, labs and discussed the procedure including the risks, benefits and alternatives for the proposed anesthesia with the patient or authorized representative who has indicated his/her understanding and acceptance.     Plan Discussed with: CRNA  Anesthesia Plan Comments:         Anesthesia Quick Evaluation

## 2018-05-04 NOTE — Progress Notes (Signed)
   05/04/18 1900  Clinical Encounter Type  Visited With Patient and family together  Visit Type Initial   Chaplain received an OR to complete an AD. Patient with family, alert, and talking. Patient requested information on AD, but would prefer to discuss and have education tomorrow following recovery from today's surgery. Patient interested in putting "written" plan in place. Will page on-call chaplain when ready to discuss.

## 2018-05-04 NOTE — Anesthesia Postprocedure Evaluation (Signed)
Anesthesia Post Note  Patient: Evelyn Holloway  Procedure(s) Performed: LAPAROSCOPIC ASSISTED VAGINAL HYSTERECTOMY WITH BILATERAL SALPINGO OOPHORECTOMY (Bilateral )  Patient location during evaluation: PACU Anesthesia Type: General Level of consciousness: awake and alert Pain management: pain level controlled Vital Signs Assessment: post-procedure vital signs reviewed and stable Respiratory status: spontaneous breathing, nonlabored ventilation, respiratory function stable and patient connected to nasal cannula oxygen Cardiovascular status: blood pressure returned to baseline and stable Postop Assessment: no apparent nausea or vomiting Anesthetic complications: no     Last Vitals:  Vitals:   05/04/18 1212 05/04/18 1225  BP: 108/76 115/80  Pulse: 70 74  Resp: (!) 22 14  Temp:    SpO2: 96% 96%    Last Pain:  Vitals:   05/04/18 1240  TempSrc:   PainSc: 3                  Jasmin Winberry S

## 2018-05-04 NOTE — Transfer of Care (Signed)
Immediate Anesthesia Transfer of Care Note  Patient: Evelyn Holloway  Procedure(s) Performed: LAPAROSCOPIC ASSISTED VAGINAL HYSTERECTOMY WITH BILATERAL SALPINGO OOPHORECTOMY (Bilateral )  Patient Location: PACU  Anesthesia Type:General  Level of Consciousness: sedated  Airway & Oxygen Therapy: Patient Spontanous Breathing and Patient connected to face mask oxygen  Post-op Assessment: Report given to RN and Post -op Vital signs reviewed and stable  Post vital signs: Reviewed and stable  Last Vitals:  Vitals Value Taken Time  BP 116/69 05/04/2018 11:39 AM  Temp    Pulse 80 05/04/2018 11:42 AM  Resp 16 05/04/2018 11:42 AM  SpO2 98 % 05/04/2018 11:42 AM  Vitals shown include unvalidated device data.  Last Pain:  Vitals:   05/04/18 0627  TempSrc: Tympanic  PainSc: 0-No pain         Complications: No apparent anesthesia complications

## 2018-05-04 NOTE — Interval H&P Note (Signed)
History and Physical Interval Note:  05/04/2018 8:00 AM  Evelyn Holloway  has presented today for surgery, with the diagnosis of ENDOMETRIOSIS DETERMINED BY LAPAROSCOPY  The various methods of treatment have been discussed with the patient and family. After consideration of risks, benefits and other options for treatment, the patient has consented to  Procedure(s): [place] (Bilateral) as a surgical intervention .  The patient's history has been reviewed, patient examined, no change in status, stable for surgery.  I have reviewed the patient's chart and labs.  Questions were answered to the patient's satisfaction.     Hassell Done A Brenae Lasecki

## 2018-05-05 ENCOUNTER — Other Ambulatory Visit: Payer: Self-pay | Admitting: Obstetrics and Gynecology

## 2018-05-05 ENCOUNTER — Encounter: Payer: PRIVATE HEALTH INSURANCE | Admitting: Obstetrics and Gynecology

## 2018-05-05 DIAGNOSIS — N800 Endometriosis of uterus: Secondary | ICD-10-CM | POA: Diagnosis not present

## 2018-05-05 LAB — HEMOGLOBIN: Hemoglobin: 13.2 g/dL (ref 12.0–16.0)

## 2018-05-05 MED ORDER — PHENAZOPYRIDINE HCL 200 MG PO TABS: 200.0000 mg | ORAL_TABLET | Freq: Three times a day (TID) | ORAL | 1 refills | Status: DC | PRN

## 2018-05-05 MED ORDER — IBUPROFEN 600 MG PO TABS: 600.0000 mg | ORAL_TABLET | Freq: Four times a day (QID) | ORAL | 0 refills | Status: DC

## 2018-05-05 MED ORDER — ACETAMINOPHEN 500 MG PO TABS: 1000.0000 mg | ORAL_TABLET | Freq: Four times a day (QID) | ORAL | 0 refills | Status: DC

## 2018-05-05 MED ORDER — TRAMADOL HCL 50 MG PO TABS: 50.0000 mg | ORAL_TABLET | Freq: Four times a day (QID) | ORAL | 0 refills | Status: DC | PRN

## 2018-05-05 NOTE — Discharge Summary (Addendum)
Physician Discharge Summary  Patient ID: Evelyn Holloway MRN: [id number] DOB/AGE: April 13, 1971 47 y.o.  Admit date: 05/04/2018 Discharge date: 05/05/2018  Admission Diagnoses: Endometriosis and Fibroids  Discharge Diagnoses:  Endometriosis and Fibroids  Operative Procedures: Procedure(s): LAPAROSCOPIC ASSISTED VAGINAL HYSTERECTOMY WITH BILATERAL SALPINGO OOPHORECTOMY (Bilateral)  Hospital Course: Uncomplicated .   Significant Diagnostic Studies:  Lab Results  Component Value Date   HGB 13.2 05/05/2018   HGB 15.5 04/28/2018   HGB 14.9 01/30/2018   Lab Results  Component Value Date   HCT 45.0 04/28/2018   HCT 43.7 01/30/2018   HCT 42.3 10/19/2017   CBC Latest Ref Rng & Units 05/05/2018 04/28/2018 01/30/2018  WBC 3.6 - 11.0 K/uL - 8.2 9.0  Hemoglobin 12.0 - 16.0 g/dL 13.2 15.5 14.9  Hematocrit 35.0 - 47.0 % - 45.0 43.7  Platelets 150 - 440 K/uL - 232 241     Discharged Condition: good  Discharge Exam: Blood pressure 119/80, pulse (!) 55, temperature 98 F (36.7 C), resp. rate 18, height 5\' 2"  (1.575 m), weight 156 lb 8 oz (71 kg), last menstrual period 03/04/2018, SpO2 99 %. Incision/Wound: clean, dry and no drainage  Disposition: Discharge disposition: 01-Home or Self Care       Discharge Instructions    Discharge patient   Complete by:  As directed    Discharge disposition:  01-Home or Self Care   Discharge patient date:  05/05/2018     Allergies as of 05/05/2018   No Known Allergies     Medication List    STOP taking these medications   medroxyPROGESTERone 10 MG tablet Commonly known as:  PROVERA   naproxen 500 MG tablet Commonly known as:  NAPROSYN     TAKE these medications   acetaminophen 500 MG tablet Commonly known as:  TYLENOL Take 2 tablets (1,000 mg total) by mouth 4 (four) times daily.   cyclobenzaprine 10 MG tablet Commonly known as:  FLEXERIL Take 10 mg by mouth as needed for muscle spasms.   Diclofenac Sodium 3 % Gel Place  1 application onto the skin every 12 (twelve) hours as needed (pain).   FLUoxetine 10 MG tablet Commonly known as:  PROZAC Take 10 mg by mouth every evening.   ibuprofen 600 MG tablet Commonly known as:  ADVIL,MOTRIN Take 1 tablet (600 mg total) by mouth 4 (four) times daily.   levothyroxine 25 MCG tablet Commonly known as:  SYNTHROID, LEVOTHROID TAKE 1 TABLET (25 MCG TOTAL) BY MOUTH DAILY BEFORE BREAKFAST.   multivitamin with minerals tablet Take 1 tablet by mouth daily.   phenazopyridine 200 MG tablet Commonly known as:  PYRIDIUM Take 1 tablet (200 mg total) by mouth 3 (three) times daily as needed for pain (urethral spasm).   traMADol 50 MG tablet Commonly known as:  ULTRAM Take 1 tablet (50 mg total) by mouth every 6 (six) hours as needed.   Vitamin D (Ergocalciferol) 50000 units Caps capsule Commonly known as:  DRISDOL TAKE 1 CAPSULE BY MOUTH ONCE A WEEK. What changed:    how much to take  how to take this  when to take this      Follow-up Information    Jenay Morici, Alanda Slim, MD In 1 week.   Specialties:  Obstetrics and Gynecology, Radiology Why:  Post Op Check Contact information: [place] [address] [phone]           Signed: Alanda Slim Naijah Lacek 05/05/2018, 9:52 AM

## 2018-05-05 NOTE — Progress Notes (Signed)
Provided and reviewed discharge paperwork and prescriptions. Pt verbalized understanding of information provided, teach back method used. Follow up appt next Thursday with Dr. Enzo Bi. Discharged to visitor entrance by hospital volunteer to go home, transported by daughter.

## 2018-05-06 LAB — SURGICAL PATHOLOGY

## 2018-05-14 ENCOUNTER — Encounter: Payer: BLUE CROSS/BLUE SHIELD | Admitting: Obstetrics and Gynecology

## 2018-05-14 ENCOUNTER — Other Ambulatory Visit: Payer: Self-pay | Admitting: Obstetrics and Gynecology

## 2018-05-14 ENCOUNTER — Ambulatory Visit (INDEPENDENT_AMBULATORY_CARE_PROVIDER_SITE_OTHER): Payer: BLUE CROSS/BLUE SHIELD | Admitting: Obstetrics and Gynecology

## 2018-05-14 ENCOUNTER — Other Ambulatory Visit: Payer: Self-pay

## 2018-05-14 VITALS — BP 127/85 | HR 76 | Ht 62.0 in | Wt 154.0 lb

## 2018-05-14 DIAGNOSIS — R399 Unspecified symptoms and signs involving the genitourinary system: Secondary | ICD-10-CM | POA: Diagnosis not present

## 2018-05-14 DIAGNOSIS — Z90710 Acquired absence of both cervix and uterus: Secondary | ICD-10-CM

## 2018-05-14 DIAGNOSIS — E8940 Asymptomatic postprocedural ovarian failure: Secondary | ICD-10-CM | POA: Insufficient documentation

## 2018-05-14 DIAGNOSIS — E8941 Symptomatic postprocedural ovarian failure: Secondary | ICD-10-CM

## 2018-05-14 DIAGNOSIS — Z79890 Hormone replacement therapy (postmenopausal): Secondary | ICD-10-CM

## 2018-05-14 DIAGNOSIS — Z09 Encounter for follow-up examination after completed treatment for conditions other than malignant neoplasm: Secondary | ICD-10-CM

## 2018-05-14 LAB — POCT URINALYSIS DIPSTICK
BILIRUBIN UA: NEGATIVE
GLUCOSE UA: NEGATIVE
KETONES UA: NEGATIVE
Nitrite, UA: NEGATIVE
Protein, UA: NEGATIVE
Spec Grav, UA: 1.015 (ref 1.010–1.025)
UROBILINOGEN UA: 0.2 U/dL
pH, UA: 5 (ref 5.0–8.0)

## 2018-05-14 MED ORDER — VITAMIN D (ERGOCALCIFEROL) 1.25 MG (50000 UNIT) PO CAPS: ORAL_CAPSULE | ORAL | 3 refills | Status: DC

## 2018-05-14 MED ORDER — ESTRADIOL 1 MG PO TABS: 1.0000 mg | ORAL_TABLET | Freq: Every day | ORAL | 12 refills | Status: DC

## 2018-05-14 MED ORDER — VITAMIN D (ERGOCALCIFEROL) 1.25 MG (50000 UNIT) PO CAPS: ORAL_CAPSULE | ORAL | 3 refills | Status: AC

## 2018-05-14 MED ORDER — NITROFURANTOIN MONOHYD MACRO 100 MG PO CAPS
100.0000 mg | ORAL_CAPSULE | Freq: Two times a day (BID) | ORAL | 1 refills | Status: DC
Start: 2018-05-14 — End: 2018-06-04

## 2018-05-14 NOTE — Progress Notes (Signed)
Pt states when standing up and after eating she feels stabbing pains in stomach. She also states that incision has an odd odor. Also experiencing a lot of vaginal discharge and hot flashes with dizzy spells.  Chief complaint: 1.  Postop check 2.  Status post LAVH BSO 05/04/2018  Patient presents for follow-up from surgery on 05/04/2018. Vasomotor symptoms: Hot flashes, night sweats, dizziness, and headaches have been noted since surgery.  She is getting drenched at night having to change bed close as well as pajamas.  She is not experiencing any fevers. Abdominal cramping: Postoperatively patient had increased symptoms of abdominal cramping with peristalsis type movement and constipation.  She had a couple days of nausea with vomiting which have since resolved.  She reports no fever.  Bowel function is now regulated after using stool softeners and MiraLAX.  Initially use of MiraLAX product caused diarrhea. Patient has had nocturia x5 and urinary frequency without dysuria since surgery.  She denies fever chills or sweats.  She denies flank pain. Vaginal odor: Since surgery patient has noted minimal discharge with spotting from the vagina and a malodor.  No significant pelvic pain.  Pathology from surgery:  Ectocervical endometriosis Adenomyosis Subserosal leiomyoma Left ovarian fibroma Follicular cysts bilateral ovaries N OBJECTIVE: Ormal fallopian tubes  BP 127/85   Pulse 76   Ht 5\' 2"  (1.575 m)   Wt 154 lb (69.9 kg)   BMI 28.17 kg/m  Pleasant female in no acute distress.  Alert and oriented. Back: No CVA tenderness or spinal tenderness Abdomen: Flat, soft, nondistended, nontender; laparoscopy incision sites are well approximated and healing; Dermabond glue was overlying the incisions. Bladder: Nontender Pelvic exam: X-rays obtained-normal BUS-normal Vagina-good estrogen effect; good vault support; vaginal cuff intact; there is some minimal necrotic tissue associated with suture at  the vaginal cuff without significant discharge or bleeding.  Bimanual reveals appropriate cuff induration for postsurgical state without mass or significant tenderness Cervix-surgically absent Uterus - Surgically absent Adnexa-nonpalpable nontender rectovaginal-normal external exam  ASSESSMENT: 1.  Surgical menopause, symptomatic 2.  Status post LAVH BSO 3.  UTI type symptoms with nocturia and frequency 4.  Abdominal cramping, unclear etiology, nonacute  PLAN: 1.  Urinalysis and urine culture 2.  Estradiol 1 mg daily 3.  Continue with nonnarcotic analgesics 4.  Return second week in August for follow-up or sooner if symptoms of nausea vomiting abdominal pain, fever develop.  Brayton Mars, MD  Note: This dictation was prepared with Dragon dictation along with smaller phrase technology. Any transcriptional errors that result from this process are unintentional.

## 2018-05-14 NOTE — Patient Instructions (Signed)
1.  Begin estradiol 1 mg a day for vasomotor symptoms due to surgical menopause 2.  Urinalysis and urine culture are obtained today to rule out bladder infection 3.  Return the second week in August as scheduled for follow-up postop check

## 2018-05-16 LAB — URINE CULTURE

## 2018-05-22 ENCOUNTER — Telehealth: Payer: Self-pay | Admitting: Obstetrics and Gynecology

## 2018-05-22 NOTE — Telephone Encounter (Signed)
She had back pain yesterday and went to urgent care, and they put her on a steroid and did an xray, nothing anatomically wrong, but she is now bleeding like a period.  Using 1 pad/day yesterday and now 2 per day and more bright red/fresh blood.  She is asking what to do and for the nurse to call [phone] and tell her what she needs to do.  At urgent care, she was given METHYLPREDNISOLONE 4 mg TO TAKE 6 AT ALL ONCE and now 2 w/ meals 3x a day for 6 days.  She was told by her case manager not to stop the medication unless her GYN advised her to.  Please advise, thanks.

## 2018-05-25 NOTE — Telephone Encounter (Signed)
Pt pulled a back muscle. She was in a lot of pain.  Started bleeding. Changing q 4 hours. It is much better today. Very light. Only when she wipes. Advised pt to finish prednisone. If bleeding gets worse to call back for an appt. Pt voices understanding.

## 2018-06-04 ENCOUNTER — Encounter: Payer: Self-pay | Admitting: Obstetrics and Gynecology

## 2018-06-04 ENCOUNTER — Ambulatory Visit (INDEPENDENT_AMBULATORY_CARE_PROVIDER_SITE_OTHER): Payer: BLUE CROSS/BLUE SHIELD | Admitting: Obstetrics and Gynecology

## 2018-06-04 VITALS — BP 132/85 | HR 96 | Ht 62.0 in | Wt 154.2 lb

## 2018-06-04 DIAGNOSIS — N8000 Endometriosis of the uterus, unspecified: Secondary | ICD-10-CM

## 2018-06-04 DIAGNOSIS — Z09 Encounter for follow-up examination after completed treatment for conditions other than malignant neoplasm: Secondary | ICD-10-CM

## 2018-06-04 DIAGNOSIS — Z90710 Acquired absence of both cervix and uterus: Secondary | ICD-10-CM

## 2018-06-04 NOTE — Patient Instructions (Signed)
1.  Resume all activities without restriction with the exception of intercourse (for another 2 weeks) 2.  Return in 1 year for follow-up 3.  Continue estradiol 1 mg a day for ERT therapy 4.  Continue vitamin D 50,000 units weekly 5.  Begin calcium supplementation 600 mg twice a day 6.  Recommend weightbearing exercise to maintain bone strength and avoid osteoporosis

## 2018-06-04 NOTE — Progress Notes (Signed)
Chief complaint: 1.  Final postop check 2.  Status post LAVH BSO  PATHOLOGY: Surgical Pathology  CASE: [phone]  PATIENT: Evelyn Holloway  Surgical Pathology Report  SPECIMEN SUBMITTED:  A. Uterus with cervix bilateral tubes and ovaries   CLINICAL HISTORY:  None provided   PRE-OPERATIVE DIAGNOSIS:  Endometriosis determined by laparoscopy   POST-OPERATIVE DIAGNOSIS:  None provided.  DIAGNOSIS:  A. UTERUS WITH CERVIX; HYSTERECTOMY:  - ECTOCERVICAL ENDOMETRIOSIS.  - ATROPHIC ENDOMETRIUM.  - ADENOMYOSIS.  - SUBSEROSAL LEIOMYOMA (118 GRAM UTERUS).   BILATERAL FALLOPIAN TUBES AND OVARIES; SALPINGO-OOPHORECTOMY:  - FOLLICULAR CYSTS, BILATERAL OVARIES.  - PEDUNCULATED 4 MM FIBROMA, LEFT OVARY.  - UNREMARKABLE FALLOPIAN TUBES.    Presents for final postop check after surgery.  She is doing remarkably well with normal bowel and bladder function.  She is not experiencing any significant pelvic pain.  Vasomotor symptoms are well controlled with the estradiol 1 mg a day ERT therapy.  She has minimal vaginal discharge and no vaginal bleeding.  OBJECTIVE:  BP 132/85   Pulse 96   Ht 5\' 2"  (1.575 m)   Wt 154 lb 3.2 oz (69.9 kg)   LMP 03/05/2018   BMI 28.20 kg/m  Pleasant female no acute distress.  Alert and oriented. Abdomen: Soft, nontender; laparoscopy port sites are well approximated.  Residual suture is noted in the left lower quadrant laparoscopy port site.  There is no drainage or erythema noted incisions. Pelvic exam: External genitalia-normal BUS-normal Vagina-normal estrogen effect; good vault support; vaginal cuff has residual suture at the apex; bimanual exam reveals no significant mass or tenderness Cervix-surgically absent Uterus-surgically absent Adnexa-nonpalpable nontender Rectovaginal-normal external exam  ASSESSMENT: 1.  4 weeks status post LAVH BSO for symptomatic endometriosis 2.  Normal postop check; residual suture is still present at the vaginal  cuff 3.  Vasomotor symptoms are well controlled with ERT  PLAN: 1.  Resume activities as tolerated.  Hold off on intercourse for another 2 weeks 2.  Continue estradiol 1 mg a day; continue vitamin D 50,000 international units weekly; begin calcium supplementation 600 mg twice a day. 3.  Return in 1 year for follow-up or as needed if pelvic symptoms arise.  Brayton Mars, MD  Note: This dictation was prepared with Dragon dictation along with smaller phrase technology. Any transcriptional errors that result from this process are unintentional.

## 2018-06-06 ENCOUNTER — Other Ambulatory Visit: Payer: Self-pay | Admitting: Obstetrics and Gynecology

## 2018-06-08 ENCOUNTER — Other Ambulatory Visit: Payer: Self-pay | Admitting: Obstetrics and Gynecology

## 2018-06-08 DIAGNOSIS — Z1231 Encounter for screening mammogram for malignant neoplasm of breast: Secondary | ICD-10-CM

## 2018-06-09 ENCOUNTER — Encounter: Payer: BLUE CROSS/BLUE SHIELD | Admitting: Obstetrics and Gynecology

## 2018-06-25 ENCOUNTER — Encounter: Payer: PRIVATE HEALTH INSURANCE | Admitting: Obstetrics and Gynecology

## 2018-07-09 ENCOUNTER — Ambulatory Visit
Admission: RE | Admit: 2018-07-09 | Discharge: 2018-07-09 | Disposition: A | Discharge status: Home / Self Care | Payer: BLUE CROSS/BLUE SHIELD | Source: Ambulatory Visit | Attending: Obstetrics and Gynecology | Admitting: Obstetrics and Gynecology

## 2018-07-09 DIAGNOSIS — Z1231 Encounter for screening mammogram for malignant neoplasm of breast: Secondary | ICD-10-CM | POA: Diagnosis not present

## 2018-07-10 ENCOUNTER — Encounter: Payer: Self-pay | Admitting: Obstetrics and Gynecology

## 2018-07-10 ENCOUNTER — Ambulatory Visit (INDEPENDENT_AMBULATORY_CARE_PROVIDER_SITE_OTHER): Payer: BLUE CROSS/BLUE SHIELD | Admitting: Obstetrics and Gynecology

## 2018-07-10 VITALS — BP 123/82 | HR 69 | Ht 62.0 in | Wt 153.6 lb

## 2018-07-10 DIAGNOSIS — Z01419 Encounter for gynecological examination (general) (routine) without abnormal findings: Secondary | ICD-10-CM | POA: Diagnosis not present
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
## 2018-09-04 ENCOUNTER — Other Ambulatory Visit: Payer: Self-pay | Admitting: Orthopedic Surgery

## 2018-09-04 DIAGNOSIS — M25512 Pain in left shoulder: Secondary | ICD-10-CM

## 2018-09-24 ENCOUNTER — Ambulatory Visit
Admission: RE | Admit: 2018-09-24 | Discharge: 2018-09-24 | Disposition: A | Discharge status: Home / Self Care | Payer: BLUE CROSS/BLUE SHIELD | Source: Ambulatory Visit | Attending: Orthopedic Surgery | Admitting: Orthopedic Surgery

## 2018-09-24 ENCOUNTER — Other Ambulatory Visit: Payer: Self-pay | Admitting: Orthopedic Surgery

## 2018-09-24 DIAGNOSIS — M19011 Primary osteoarthritis, right shoulder: Secondary | ICD-10-CM | POA: Diagnosis not present

## 2018-09-24 DIAGNOSIS — M25512 Pain in left shoulder: Secondary | ICD-10-CM

## 2018-09-24 MED ORDER — GADOBUTROL 1 MMOL/ML IV SOLN
0.0500 mL | Freq: Once | INTRAVENOUS | Status: AC | PRN
  Administered 2018-09-24: 0.05 mL

## 2018-09-24 MED ORDER — IOPAMIDOL (ISOVUE-300) INJECTION 61%
7.0000 mL | Freq: Once | INTRAVENOUS | Status: AC | PRN
  Administered 2018-09-24: 7 mL

## 2018-09-24 MED ORDER — SODIUM CHLORIDE (PF) 0.9 % IJ SOLN
15.0000 mL | Freq: Once | INTRAMUSCULAR | Status: AC
  Administered 2018-09-24: 15 mL

## 2018-09-24 MED ORDER — LIDOCAINE HCL (PF) 1 % IJ SOLN
5.0000 mL | Freq: Once | INTRAMUSCULAR | Status: AC
  Administered 2018-09-24: 5 mL
  Filled 2018-09-24: qty 5

## 2018-09-28 ENCOUNTER — Encounter: Payer: Self-pay | Admitting: Obstetrics and Gynecology

## 2018-09-28 NOTE — Telephone Encounter (Signed)
Message documented in wrong patients chart. Message has been copied to correct chart.

## 2018-09-28 NOTE — Telephone Encounter (Signed)
The patient states she had a culture done recently but has not gotten those results yet; however, she is having dysuria, frequency and is worried she will start having a fever and is asking if she can get a prescription called in today if possible, please advise, thanks.

## 2019-03-02 ENCOUNTER — Emergency Department
Admission: EM | Admit: 2019-03-02 | Discharge: 2019-03-02 | Disposition: A | Discharge status: Home / Self Care | Payer: Worker's Compensation | Attending: Emergency Medicine | Admitting: Emergency Medicine

## 2019-03-02 ENCOUNTER — Other Ambulatory Visit: Payer: Self-pay

## 2019-03-02 ENCOUNTER — Emergency Department: Payer: Worker's Compensation

## 2019-03-02 DIAGNOSIS — Z79899 Other long term (current) drug therapy: Secondary | ICD-10-CM | POA: Diagnosis not present

## 2019-03-02 DIAGNOSIS — M549 Dorsalgia, unspecified: Secondary | ICD-10-CM | POA: Diagnosis not present

## 2019-03-02 DIAGNOSIS — Z87891 Personal history of nicotine dependence: Secondary | ICD-10-CM | POA: Insufficient documentation

## 2019-03-02 DIAGNOSIS — E039 Hypothyroidism, unspecified: Secondary | ICD-10-CM | POA: Diagnosis not present

## 2019-03-02 LAB — BASIC METABOLIC PANEL
Anion gap: 8 (ref 5–15)
BUN: 20 mg/dL (ref 6–20)
CO2: 26 mmol/L (ref 22–32)
Calcium: 9.4 mg/dL (ref 8.9–10.3)
Chloride: 105 mmol/L (ref 98–111)
Creatinine, Ser: 0.69 mg/dL (ref 0.44–1.00)
GFR calc Af Amer: >60 mL/min (ref >60)
GFR calc non Af Amer: >60 mL/min (ref >60)
Glucose, Bld: 99 mg/dL (ref 70–99)
Potassium: 4.7 mmol/L (ref 3.5–5.1)
Sodium: 139 mmol/L (ref 135–145)

## 2019-03-02 LAB — URINALYSIS, COMPLETE (UACMP) WITH MICROSCOPIC
Bilirubin Urine: NEGATIVE
Glucose, UA: NEGATIVE mg/dL
Hgb urine dipstick: NEGATIVE
Ketones, ur: NEGATIVE mg/dL
Leukocytes,Ua: NEGATIVE
Nitrite: NEGATIVE
Protein, ur: NEGATIVE mg/dL
Specific Gravity, Urine: 1.006 (ref 1.005–1.030)
pH: 5 (ref 5.0–8.0)

## 2019-03-02 LAB — CBC
HCT: 46.6 % — ABNORMAL HIGH (ref 36.0–46.0)
Hemoglobin: 15.3 g/dL — ABNORMAL HIGH (ref 12.0–15.0)
MCH: 31 pg (ref 26.0–34.0)
MCHC: 32.8 g/dL (ref 30.0–36.0)
MCV: 94.3 fL (ref 80.0–100.0)
Platelets: 291 10*3/uL (ref 150–400)
RBC: 4.94 MIL/uL (ref 3.87–5.11)
RDW: 11.9 % (ref 11.5–15.5)
WBC: 8.8 10*3/uL (ref 4.0–10.5)
nRBC: 0 % (ref 0.0–0.2)

## 2019-03-02 MED ORDER — OXYCODONE-ACETAMINOPHEN 5-325 MG PO TABS
1.0000 | ORAL_TABLET | Freq: Once | ORAL | Status: AC
  Administered 2019-03-02: 1 via ORAL
  Filled 2019-03-02: qty 1

## 2019-03-02 MED ORDER — PREDNISONE 50 MG PO TABS: ORAL_TABLET | ORAL | 0 refills | Status: DC

## 2019-03-02 MED ORDER — KETOROLAC TROMETHAMINE 10 MG PO TABS: 10.0000 mg | ORAL_TABLET | Freq: Four times a day (QID) | ORAL | 0 refills | Status: DC | PRN

## 2019-03-02 MED ORDER — GADOBUTROL 1 MMOL/ML IV SOLN
7.0000 mL | Freq: Once | INTRAVENOUS | Status: AC | PRN
  Administered 2019-03-02: 15:00:00 7 mL via INTRAVENOUS
  Filled 2019-03-02: qty 7.5

## 2019-03-02 MED ORDER — BACLOFEN 10 MG PO TABS: 10.0000 mg | ORAL_TABLET | Freq: Every day | ORAL | 0 refills | Status: DC

## 2019-03-02 MED ORDER — DIAZEPAM 2 MG PO TABS
2.0000 mg | ORAL_TABLET | Freq: Once | ORAL | Status: AC
  Administered 2019-03-02: 2 mg via ORAL
  Filled 2019-03-02: qty 1

## 2019-03-02 MED ORDER — ORPHENADRINE CITRATE 30 MG/ML IJ SOLN
60.0000 mg | Freq: Two times a day (BID) | INTRAMUSCULAR | Status: DC
  Administered 2019-03-02: 12:00:00 60 mg via INTRAMUSCULAR
  Filled 2019-03-02: qty 2

## 2019-03-02 MED ORDER — KETOROLAC TROMETHAMINE 30 MG/ML IJ SOLN
30.0000 mg | Freq: Once | INTRAMUSCULAR | Status: AC
  Administered 2019-03-02: 12:00:00 30 mg via INTRAMUSCULAR
  Filled 2019-03-02: qty 1

## 2019-03-02 MED ORDER — LIDOCAINE 5 % EX PTCH: 1.0000 | MEDICATED_PATCH | CUTANEOUS | 0 refills | Status: DC
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
## 2019-03-02 NOTE — ED Triage Notes (Signed)
Pt c/o mid to upper back pain since yesterday, states she has chronic issues with her back but is not able to get this pain/spasms under control with muscle relaxer's and naproxen.
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
## 2019-03-02 NOTE — Discharge Instructions (Signed)
I have given you a prescription for baclofen and Toradol.  You can either take baclofen or Flexeril, not both.  You can either take Toradol or naproxen, not both.  Use heat to your back tonight.  Please call Dr. Ronnald Ramp for an appointment for follow-up.  You can also call your second opinion to see if this appointment can be rescheduled for sooner.  Return the emergency department for worsening symptoms.

## 2019-03-02 NOTE — ED Notes (Signed)
Patient transported to MRI 

## 2019-03-02 NOTE — ED Notes (Signed)
See triage note  Presents with mid back pain  States pain started yesterday  Worse with movement  And describe pain as "muscle spasm" like  States she took a Naprosyn and flexeril and used a heating pad last pm with min relief  States yesterday she was not able to move w/o assistance

## 2019-05-03 ENCOUNTER — Telehealth: Payer: Self-pay | Admitting: Obstetrics and Gynecology

## 2019-05-03 ENCOUNTER — Other Ambulatory Visit: Payer: Self-pay | Admitting: Obstetrics and Gynecology

## 2019-05-03 NOTE — Telephone Encounter (Signed)
The patient called and requested to have Evelyn Holloway put her orders in for a mammogram early due to some issues she is having the patient is wanting to schedule her mammogram at [facility]. The patient is  Requesting a call back once the orders are placed so she knows to schedule her appointment. Please advise.

## 2019-05-04 ENCOUNTER — Other Ambulatory Visit: Payer: Self-pay | Admitting: Obstetrics and Gynecology

## 2019-05-04 DIAGNOSIS — Z1231 Encounter for screening mammogram for malignant neoplasm of breast: Secondary | ICD-10-CM

## 2019-05-04 NOTE — Telephone Encounter (Signed)
Order is in, but please make sure she knows last year it was done on 9/19, so insurance may not cover it earlier.

## 2019-05-05 NOTE — Telephone Encounter (Signed)
appt made for pt she is having issue with breast

## 2019-05-07 ENCOUNTER — Encounter: Payer: Self-pay | Admitting: Certified Nurse Midwife

## 2019-05-07 ENCOUNTER — Ambulatory Visit (INDEPENDENT_AMBULATORY_CARE_PROVIDER_SITE_OTHER): Payer: BC Managed Care – PPO | Admitting: Certified Nurse Midwife

## 2019-05-07 ENCOUNTER — Other Ambulatory Visit: Payer: Self-pay

## 2019-05-07 VITALS — BP 127/90 | HR 66 | Ht 65.0 in | Wt 158.2 lb

## 2019-05-07 DIAGNOSIS — L708 Other acne: Secondary | ICD-10-CM

## 2019-05-07 NOTE — Patient Instructions (Signed)
Skin Abscess  A skin abscess is an infected area of your skin that contains pus and other material. An abscess can happen in any part of your body. Some abscesses break open (rupture) on their own. Most continue to get worse unless they are treated. The infection can spread deeper into the body and into your blood, which can make you feel sick. A skin abscess is caused by germs that enter the skin through a cut or scrape. It can also be caused by blocked oil and sweat glands or infected hair follicles. This condition is usually treated by:  Draining the pus.  Taking antibiotic medicines.  Placing a warm, wet washcloth over the abscess. Follow these instructions at home: Medicines   Take over-the-counter and prescription medicines only as told by your doctor.  If you were prescribed an antibiotic medicine, take it as told by your doctor. Do not stop taking the antibiotic even if you start to feel better. Abscess care   If you have an abscess that has not drained, place a warm, clean, wet washcloth over the abscess several times a day. Do this as told by your doctor.  Follow instructions from your doctor about how to take care of your abscess. Make sure you: ? Cover the abscess with a bandage (dressing). ? Change your bandage or gauze as told by your doctor. ? Wash your hands with soap and water before you change the bandage or gauze. If you cannot use soap and water, use hand sanitizer.  Check your abscess every day for signs that the infection is getting worse. Check for: ? More redness, swelling, or pain. ? More fluid or blood. ? Warmth. ? More pus or a bad smell. General instructions  To avoid spreading the infection: ? Do not share personal care items, towels, or hot tubs with others. ? Avoid making skin-to-skin contact with other people.  Keep all follow-up visits as told by your doctor. This is important. Contact a doctor if:  You have more redness, swelling, or pain  around your abscess.  You have more fluid or blood coming from your abscess.  Your abscess feels warm when you touch it.  You have more pus or a bad smell coming from your abscess.  You have a fever.  Your muscles ache.  You have chills.  You feel sick. Get help right away if:  You have very bad (severe) pain.  You see red streaks on your skin spreading away from the abscess. Summary  A skin abscess is an infected area of your skin that contains pus and other material.  The abscess is caused by germs that enter the skin through a cut or scrape. It can also be caused by blocked oil and sweat glands or infected hair follicles.  Follow your doctor's instructions on caring for your abscess, taking medicines, preventing infections, and keeping follow-up visits. This information is not intended to replace advice given to you by your health care provider. Make sure you discuss any questions you have with your health care provider. Document Released: 03/25/2008 Document Revised: 01/28/2019 Document Reviewed: 11/20/2017 Elsevier Patient Education  2020 [employer].
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
## 2019-06-03 ENCOUNTER — Encounter: Payer: BLUE CROSS/BLUE SHIELD | Admitting: Obstetrics and Gynecology

## 2019-07-21 ENCOUNTER — Encounter: Payer: BLUE CROSS/BLUE SHIELD | Admitting: Obstetrics and Gynecology

## 2019-08-13 ENCOUNTER — Encounter: Payer: BC Managed Care – PPO | Admitting: Obstetrics and Gynecology

## 2019-08-31 ENCOUNTER — Telehealth: Payer: Self-pay

## 2019-08-31 MED ORDER — ESTRADIOL 1 MG PO TABS: 1.0000 mg | ORAL_TABLET | Freq: Every day | ORAL | 0 refills | Status: DC

## 2019-08-31 NOTE — Telephone Encounter (Signed)
ESTRIDIL six days left. Pt has appt 09/09/19 physical with [facility]. Pt has hx hysterectomy 04/2018 states must have Estridil.  Pt uses [facility] [address] (not at [facility]).

## 2019-08-31 NOTE — Telephone Encounter (Signed)
Prescription sent to the pharmacy. I have notified patient of this.

## 2019-09-07 ENCOUNTER — Ambulatory Visit
Admission: RE | Admit: 2019-09-07 | Discharge: 2019-09-07 | Disposition: A | Discharge status: Home / Self Care | Payer: BC Managed Care – PPO | Source: Ambulatory Visit | Attending: Obstetrics and Gynecology | Admitting: Obstetrics and Gynecology

## 2019-09-07 ENCOUNTER — Other Ambulatory Visit: Payer: Self-pay

## 2019-09-07 DIAGNOSIS — Z1231 Encounter for screening mammogram for malignant neoplasm of breast: Secondary | ICD-10-CM | POA: Diagnosis not present

## 2019-09-09 ENCOUNTER — Encounter: Payer: Self-pay | Admitting: Obstetrics and Gynecology

## 2019-09-09 ENCOUNTER — Other Ambulatory Visit: Payer: Self-pay

## 2019-09-09 ENCOUNTER — Ambulatory Visit (INDEPENDENT_AMBULATORY_CARE_PROVIDER_SITE_OTHER): Payer: BC Managed Care – PPO | Admitting: Obstetrics and Gynecology

## 2019-09-09 VITALS — BP 121/82 | HR 64 | Ht 61.0 in | Wt 164.9 lb

## 2019-09-09 DIAGNOSIS — Z79890 Hormone replacement therapy (postmenopausal): Secondary | ICD-10-CM

## 2019-09-09 DIAGNOSIS — Z01419 Encounter for gynecological examination (general) (routine) without abnormal findings: Secondary | ICD-10-CM

## 2019-09-09 DIAGNOSIS — N951 Menopausal and female climacteric states: Secondary | ICD-10-CM

## 2019-09-09 MED ORDER — ESTRADIOL 1 MG PO TABS: 1.5000 mg | ORAL_TABLET | Freq: Every day | ORAL | 3 refills | Status: DC
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
## 2019-09-20 ENCOUNTER — Other Ambulatory Visit: Payer: Self-pay | Admitting: Surgical

## 2019-09-20 DIAGNOSIS — N951 Menopausal and female climacteric states: Secondary | ICD-10-CM

## 2019-09-20 DIAGNOSIS — Z79890 Hormone replacement therapy (postmenopausal): Secondary | ICD-10-CM

## 2019-09-20 MED ORDER — ESTRADIOL 1 MG PO TABS: 1.0000 mg | ORAL_TABLET | Freq: Every day | ORAL | 3 refills | Status: AC

## 2019-09-20 MED ORDER — ESTRADIOL 0.5 MG PO TABS: 0.5000 mg | ORAL_TABLET | Freq: Every day | ORAL | 3 refills | Status: AC

## 2019-09-29 ENCOUNTER — Encounter: Payer: BC Managed Care – PPO | Admitting: Obstetrics and Gynecology

## 2020-09-13 ENCOUNTER — Encounter: Payer: BC Managed Care – PPO | Admitting: Obstetrics and Gynecology

## 2020-09-19 ENCOUNTER — Encounter: Payer: BC Managed Care – PPO | Admitting: Obstetrics and Gynecology

## 2020-09-26 ENCOUNTER — Telehealth: Payer: Self-pay

## 2020-09-26 NOTE — Telephone Encounter (Signed)
Called patient to reschedule her missed appointment with Dr. Amalia Hailey, she stated that she tried to call us on the 29th to cancel however we were closed. Patient states she is out of the country until [place] and will give [place] a call back then to make an appt.

## 2021-10-31 ENCOUNTER — Other Ambulatory Visit: Payer: Self-pay | Admitting: Physical Medicine & Rehabilitation

## 2021-10-31 ENCOUNTER — Other Ambulatory Visit (HOSPITAL_COMMUNITY): Payer: Self-pay | Admitting: Physical Medicine & Rehabilitation

## 2021-10-31 DIAGNOSIS — M542 Cervicalgia: Secondary | ICD-10-CM

## 2021-11-07 ENCOUNTER — Ambulatory Visit
Admission: RE | Admit: 2021-11-07 | Discharge: 2021-11-07 | Disposition: A | Discharge status: Home / Self Care | Payer: No Typology Code available for payment source | Source: Ambulatory Visit | Attending: Physical Medicine & Rehabilitation | Admitting: Physical Medicine & Rehabilitation

## 2021-11-07 ENCOUNTER — Other Ambulatory Visit: Payer: Self-pay

## 2021-11-07 DIAGNOSIS — M542 Cervicalgia: Secondary | ICD-10-CM | POA: Diagnosis present

## 2022-07-19 ENCOUNTER — Emergency Department
Admission: EM | Admit: 2022-07-19 | Discharge: 2022-07-19 | Disposition: A | Discharge status: Home / Self Care | Payer: No Typology Code available for payment source | Attending: Emergency Medicine | Admitting: Emergency Medicine

## 2022-07-19 ENCOUNTER — Other Ambulatory Visit: Payer: Self-pay

## 2022-07-19 ENCOUNTER — Emergency Department: Payer: No Typology Code available for payment source

## 2022-07-19 ENCOUNTER — Encounter: Payer: Self-pay | Admitting: Emergency Medicine

## 2022-07-19 DIAGNOSIS — Y92009 Unspecified place in unspecified non-institutional (private) residence as the place of occurrence of the external cause: Secondary | ICD-10-CM | POA: Insufficient documentation

## 2022-07-19 DIAGNOSIS — W5501XA Bitten by cat, initial encounter: Secondary | ICD-10-CM | POA: Insufficient documentation

## 2022-07-19 DIAGNOSIS — S6991XA Unspecified injury of right wrist, hand and finger(s), initial encounter: Secondary | ICD-10-CM | POA: Diagnosis present

## 2022-07-19 DIAGNOSIS — S61551A Open bite of right wrist, initial encounter: Secondary | ICD-10-CM | POA: Diagnosis not present

## 2022-07-19 MED ORDER — MELOXICAM 15 MG PO TABS: 15.0000 mg | ORAL_TABLET | Freq: Every day | ORAL | 0 refills | Status: AC

## 2022-07-19 MED ORDER — AMOXICILLIN-POT CLAVULANATE 875-125 MG PO TABS: 1.0000 | ORAL_TABLET | Freq: Two times a day (BID) | ORAL | 0 refills | Status: AC

## 2022-07-19 MED ORDER — OXYCODONE-ACETAMINOPHEN 5-325 MG PO TABS
1.0000 | ORAL_TABLET | Freq: Once | ORAL | Status: AC
  Administered 2022-07-19: 1 via ORAL
  Filled 2022-07-19: qty 1

## 2022-07-19 MED ORDER — HYDROCODONE-ACETAMINOPHEN 5-325 MG PO TABS: 1.0000 | ORAL_TABLET | ORAL | 0 refills | Status: AC | PRN

## 2022-07-19 MED ORDER — AMOXICILLIN-POT CLAVULANATE 875-125 MG PO TABS
1.0000 | ORAL_TABLET | Freq: Once | ORAL | Status: AC
  Administered 2022-07-19: 1 via ORAL
  Filled 2022-07-19: qty 1

## 2022-07-19 NOTE — ED Notes (Signed)
See triage note  Presents with cat bite to right wrist area   Small laceration and puncture wounds noted

## 2022-07-19 NOTE — ED Provider Notes (Signed)
[facility] Provider Note  Patient Contact: 4:20 PM (approximate)   History   Animal Bite   HPI  Evelyn Holloway is a 51 y.o. female who presents the emergency [facility] complaining of cat bite to the right wrist.  Patient raises cats for show and cell.  Patient was bitten by one of her cats as she was trying to break up to female sliding.  Patient has puncture wounds to the dorsal and ventral side of the wrist.  Up-to-date on tetanus shot.  Cats are immunized for rabies but live exclusively indoors.  No concern for rabies at this time.     Physical Exam   Triage Vital Signs: ED Triage Vitals  Enc Vitals Group     BP 07/19/22 1546 (!) 143/90     Pulse Rate 07/19/22 1546 60     Resp 07/19/22 1546 18     Temp 07/19/22 1546 98.7 F (37.1 C)     Temp Source 07/19/22 1546 Oral     SpO2 07/19/22 1546 98 %     Weight 07/19/22 1600 164 lb 14.5 oz (74.8 kg)     Height 07/19/22 1600 '5\' 1"'$  (1.549 m)     Head Circumference --      Peak Flow --      Pain Score 07/19/22 1550 8     Pain Loc --      Pain Edu? --      Excl. in [place]? --     Most recent vital signs: Vitals:   07/19/22 1546  BP: (!) 143/90  Pulse: 60  Resp: 18  Temp: 98.7 F (37.1 C)  SpO2: 98%     General: Alert and in no acute distress.   Cardiovascular:  Good peripheral perfusion Respiratory: Normal respiratory effort without tachypnea or retractions. Lungs CTAB.  Musculoskeletal: Full range of motion to all extremities.  Puncture wounds noted to the dorsal and anterior portion of the wrist.  There are no gaping wounds.  No active bleeding.  No visible foreign body.  Patient has full range of motion to the fingers and the wrist but has pain with extension and flexion of the wrist.  Sensation capillary refill intact distally. Neurologic:  No gross focal neurologic deficits are appreciated.  Skin:   No rash noted Other:   ED Results / Procedures / Treatments   Labs (all labs ordered  are listed, but only abnormal results are displayed) Labs Reviewed - No data to display   EKG     RADIOLOGY  I personally viewed, evaluated, and interpreted these images as part of my medical decision making, as well as reviewing the written report by the radiologist.  ED Provider Interpretation: No acute retained foreign body or other osseous abnormality.  DG Wrist 2 Views Right  Result Date: 07/19/2022 CLINICAL DATA:  Right wrist pain after cat bite. EXAM: RIGHT WRIST - 2 VIEW COMPARISON:  None Available. FINDINGS: There is no evidence of fracture or dislocation. There is no evidence of arthropathy or other focal bone abnormality. Soft tissues are unremarkable. IMPRESSION: Negative. Electronically Signed   By: Marijo Conception M.D.   On: 07/19/2022 16:20    PROCEDURES:  Critical Care performed: No  Procedures   MEDICATIONS ORDERED IN ED: Medications  oxyCODONE-acetaminophen (PERCOCET/ROXICET) 5-325 MG per tablet 1 tablet (1 tablet Oral Given 07/19/22 1625)  amoxicillin-clavulanate (AUGMENTIN) 875-125 MG per tablet 1 tablet (1 tablet Oral Given 07/19/22 1758)     IMPRESSION / MDM /  ASSESSMENT AND PLAN / ED COURSE  I reviewed the triage vital signs and the nursing notes.                              Differential diagnosis includes, but is not limited to, cat bite, puncture wound, retained foreign body, ligamentous injury, tendinous injury  Patient's presentation is most consistent with acute presentation with potential threat to life or bodily function.   Patient's diagnosis is consistent with cat bite.  Patient presents to the emergency department after being bitten by one of her cat.  She was trying to break up to cats fighting.  Cats are up-to-date on immunizations, are show animals and do not go outside.  No concern for rabies at this time.  Tetanus shot is up-to-date.  Areas cleansed, wound care instructions discussed with the patient.  Antibiotics prophylactically.   Follow-up with primary care as needed.. Patient is given ED precautions to return to the ED for any worsening or new symptoms.        FINAL CLINICAL IMPRESSION(S) / ED DIAGNOSES   Final diagnoses:  Cat bite, initial encounter     Rx / DC Orders   ED Discharge Orders          Ordered    HYDROcodone-acetaminophen (NORCO/VICODIN) 5-325 MG tablet  Every 4 hours PRN        07/19/22 1754    amoxicillin-clavulanate (AUGMENTIN) 875-125 MG tablet  2 times daily        07/19/22 1754    meloxicam (MOBIC) 15 MG tablet  Daily        07/19/22 1821             Note:  This document was prepared using Dragon voice recognition software and may include unintentional dictation errors.   Brynda Peon 07/19/22 1821    Carrie Mew, MD 07/19/22 Drema Halon
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
